# Patient Record
Sex: Female | Born: 1955
Health system: Southern US, Community
[De-identification: ages and names within clinical notes are randomized; demographics above are authoritative.]

## PROBLEM LIST (undated history)

## (undated) DIAGNOSIS — C801 Malignant (primary) neoplasm, unspecified: Secondary | ICD-10-CM

## (undated) DIAGNOSIS — M545 Low back pain, unspecified: Secondary | ICD-10-CM

## (undated) DIAGNOSIS — I890 Lymphedema, not elsewhere classified: Secondary | ICD-10-CM

## (undated) DIAGNOSIS — M791 Myalgia, unspecified site: Secondary | ICD-10-CM

## (undated) DIAGNOSIS — E063 Autoimmune thyroiditis: Secondary | ICD-10-CM

## (undated) DIAGNOSIS — E079 Disorder of thyroid, unspecified: Secondary | ICD-10-CM

## (undated) DIAGNOSIS — J309 Allergic rhinitis, unspecified: Secondary | ICD-10-CM

## (undated) DIAGNOSIS — K589 Irritable bowel syndrome without diarrhea: Secondary | ICD-10-CM

## (undated) HISTORY — DX: Autoimmune thyroiditis: E06.3

## (undated) HISTORY — DX: Disorder of thyroid, unspecified: E07.9

## (undated) HISTORY — PX: ABDOMINAL HYSTERECTOMY: SHX81

## (undated) HISTORY — DX: Low back pain: M54.5

## (undated) HISTORY — DX: Lymphedema, not elsewhere classified: I89.0

## (undated) HISTORY — DX: Myalgia, unspecified site: M79.10

## (undated) HISTORY — DX: Malignant (primary) neoplasm, unspecified: C80.1

## (undated) HISTORY — DX: Low back pain, unspecified: M54.50

## (undated) HISTORY — DX: Irritable bowel syndrome, unspecified: K58.9

## (undated) HISTORY — DX: Allergic rhinitis, unspecified: J30.9

---

## 2008-04-04 ENCOUNTER — Ambulatory Visit: Payer: Self-pay | Admitting: Gynecologic Oncology

## 2009-07-24 ENCOUNTER — Ambulatory Visit: Payer: Self-pay | Admitting: Gynecologic Oncology

## 2011-06-02 DIAGNOSIS — I890 Lymphedema, not elsewhere classified: Secondary | ICD-10-CM | POA: Insufficient documentation

## 2011-06-02 DIAGNOSIS — Z8541 Personal history of malignant neoplasm of cervix uteri: Secondary | ICD-10-CM | POA: Insufficient documentation

## 2011-09-01 ENCOUNTER — Ambulatory Visit: Payer: Self-pay | Admitting: Gynecologic Oncology

## 2012-11-22 ENCOUNTER — Ambulatory Visit: Payer: Self-pay | Admitting: Gynecologic Oncology

## 2013-01-18 ENCOUNTER — Ambulatory Visit: Payer: Self-pay

## 2013-04-26 ENCOUNTER — Ambulatory Visit: Payer: Self-pay | Admitting: Rheumatology

## 2013-11-27 ENCOUNTER — Ambulatory Visit: Payer: Self-pay | Admitting: Gynecologic Oncology

## 2014-03-31 ENCOUNTER — Ambulatory Visit: Payer: Self-pay

## 2014-03-31 LAB — COMPREHENSIVE METABOLIC PANEL
ALBUMIN: 3.1 g/dL — AB (ref 3.4–5.0)
ALK PHOS: 71 U/L
ALT: 25 U/L (ref 12–78)
ANION GAP: 11 (ref 7–16)
BUN: 6 mg/dL — AB (ref 7–18)
Bilirubin,Total: 0.2 mg/dL (ref 0.2–1.0)
CALCIUM: 8.8 mg/dL (ref 8.5–10.1)
CHLORIDE: 100 mmol/L (ref 98–107)
CREATININE: 0.75 mg/dL (ref 0.60–1.30)
Co2: 25 mmol/L (ref 21–32)
EGFR (African American): >60
EGFR (Non-African Amer.): >60
Glucose: 88 mg/dL (ref 65–99)
Osmolality: 269 (ref 275–301)
Potassium: 2.9 mmol/L — ABNORMAL LOW (ref 3.5–5.1)
SGOT(AST): 27 U/L (ref 15–37)
Sodium: 136 mmol/L (ref 136–145)
TOTAL PROTEIN: 6.9 g/dL (ref 6.4–8.2)

## 2014-03-31 LAB — RAPID INFLUENZA A&B ANTIGENS (ARMC ONLY)

## 2014-03-31 LAB — CBC WITH DIFFERENTIAL/PLATELET
BASOS PCT: 0.5 %
Basophil #: 0 10*3/uL (ref 0.0–0.1)
EOS ABS: 0 10*3/uL (ref 0.0–0.7)
Eosinophil %: 0.9 %
HCT: 46.4 % (ref 35.0–47.0)
HGB: 15.5 g/dL (ref 12.0–16.0)
LYMPHS ABS: 1 10*3/uL (ref 1.0–3.6)
Lymphocyte %: 27.5 %
MCH: 29.5 pg (ref 26.0–34.0)
MCHC: 33.3 g/dL (ref 32.0–36.0)
MCV: 89 fL (ref 80–100)
Monocyte #: 0.3 x10 3/mm (ref 0.2–0.9)
Monocyte %: 9 %
NEUTROS PCT: 62.1 %
Neutrophil #: 2.2 10*3/uL (ref 1.4–6.5)
Platelet: 161 10*3/uL (ref 150–440)
RBC: 5.24 10*6/uL — ABNORMAL HIGH (ref 3.80–5.20)
RDW: 12.6 % (ref 11.5–14.5)
WBC: 3.6 10*3/uL (ref 3.6–11.0)

## 2014-03-31 LAB — URINALYSIS, COMPLETE
BLOOD: NEGATIVE
Bilirubin,UR: NEGATIVE
GLUCOSE, UR: NEGATIVE mg/dL (ref 0–75)
Ketone: NEGATIVE
Leukocyte Esterase: NEGATIVE
Nitrite: NEGATIVE
Ph: 6.5 (ref 4.5–8.0)
Protein: NEGATIVE
Specific Gravity: 1.01 (ref 1.003–1.030)

## 2014-04-02 LAB — URINE CULTURE

## 2014-04-03 ENCOUNTER — Emergency Department: Payer: Self-pay | Admitting: Emergency Medicine

## 2014-04-03 ENCOUNTER — Other Ambulatory Visit: Payer: Self-pay | Admitting: Family Medicine

## 2014-04-03 LAB — MONONUCLEOSIS SCREEN: Mono Test: NEGATIVE

## 2014-04-03 LAB — URINALYSIS, COMPLETE
BACTERIA: NONE SEEN
BLOOD: NEGATIVE
Bilirubin,UR: NEGATIVE
GLUCOSE, UR: NEGATIVE mg/dL (ref 0–75)
Ketone: NEGATIVE
LEUKOCYTE ESTERASE: NEGATIVE
NITRITE: NEGATIVE
Ph: 8 (ref 4.5–8.0)
Protein: NEGATIVE
RBC,UR: 1 /HPF (ref 0–5)
Specific Gravity: 1.009 (ref 1.003–1.030)
Squamous Epithelial: <1
WBC UR: NONE SEEN /HPF (ref 0–5)

## 2014-04-03 LAB — HEPATIC FUNCTION PANEL A (ARMC)
AST: 25 U/L (ref 15–37)
Albumin: 3.4 g/dL (ref 3.4–5.0)
Alkaline Phosphatase: 76 U/L
BILIRUBIN DIRECT: 0.1 mg/dL (ref 0.00–0.20)
Bilirubin,Total: 0.5 mg/dL (ref 0.2–1.0)
SGPT (ALT): 32 U/L (ref 12–78)
Total Protein: 7.7 g/dL (ref 6.4–8.2)

## 2014-04-03 LAB — CBC WITH DIFFERENTIAL/PLATELET
Basophil #: 0 10*3/uL (ref 0.0–0.1)
Basophil %: 0.3 %
EOS PCT: 1 %
Eosinophil #: 0 10*3/uL (ref 0.0–0.7)
HCT: 48.6 % — AB (ref 35.0–47.0)
HGB: 16.3 g/dL — ABNORMAL HIGH (ref 12.0–16.0)
Lymphocyte #: 1.4 10*3/uL (ref 1.0–3.6)
Lymphocyte %: 36.8 %
MCH: 29.2 pg (ref 26.0–34.0)
MCHC: 33.5 g/dL (ref 32.0–36.0)
MCV: 87 fL (ref 80–100)
MONO ABS: 0.3 x10 3/mm (ref 0.2–0.9)
MONOS PCT: 6.8 %
NEUTROS ABS: 2.1 10*3/uL (ref 1.4–6.5)
Neutrophil %: 55.1 %
Platelet: 206 10*3/uL (ref 150–440)
RBC: 5.57 10*6/uL — ABNORMAL HIGH (ref 3.80–5.20)
RDW: 12.6 % (ref 11.5–14.5)
WBC: 3.8 10*3/uL (ref 3.6–11.0)

## 2014-04-03 LAB — POTASSIUM: Potassium: 3 mmol/L — ABNORMAL LOW (ref 3.5–5.1)

## 2014-04-03 LAB — BASIC METABOLIC PANEL
Anion Gap: 8 (ref 7–16)
BUN: 4 mg/dL — ABNORMAL LOW (ref 7–18)
CREATININE: 0.67 mg/dL (ref 0.60–1.30)
Calcium, Total: 9.2 mg/dL (ref 8.5–10.1)
Chloride: 100 mmol/L (ref 98–107)
Co2: 30 mmol/L (ref 21–32)
EGFR (African American): >60
EGFR (Non-African Amer.): >60
Glucose: 120 mg/dL — ABNORMAL HIGH (ref 65–99)
Osmolality: 274 (ref 275–301)
Potassium: 2.8 mmol/L — ABNORMAL LOW (ref 3.5–5.1)
Sodium: 138 mmol/L (ref 136–145)

## 2014-04-03 LAB — TROPONIN I: Troponin-I: <0.02 ng/mL

## 2014-04-03 LAB — CK TOTAL AND CKMB (NOT AT ARMC)
CK, TOTAL: 38 U/L
CK-MB: <0.5 ng/mL — ABNORMAL LOW (ref 0.5–3.6)

## 2014-04-03 LAB — MAGNESIUM: MAGNESIUM: 1.7 mg/dL — AB

## 2014-04-13 ENCOUNTER — Other Ambulatory Visit: Payer: Self-pay | Admitting: Family Medicine

## 2014-04-13 LAB — BASIC METABOLIC PANEL
Anion Gap: 6 — ABNORMAL LOW (ref 7–16)
BUN: 6 mg/dL — ABNORMAL LOW (ref 7–18)
CHLORIDE: 106 mmol/L (ref 98–107)
Calcium, Total: 9.1 mg/dL (ref 8.5–10.1)
Co2: 31 mmol/L (ref 21–32)
Creatinine: 0.88 mg/dL (ref 0.60–1.30)
EGFR (African American): >60
Glucose: 62 mg/dL — ABNORMAL LOW (ref 65–99)
Osmolality: 281 (ref 275–301)
Potassium: 3.8 mmol/L (ref 3.5–5.1)
Sodium: 143 mmol/L (ref 136–145)

## 2014-04-13 LAB — MAGNESIUM: Magnesium: 1.8 mg/dL

## 2014-11-28 ENCOUNTER — Ambulatory Visit: Payer: Self-pay | Admitting: Gynecologic Oncology

## 2015-05-26 IMAGING — MG MM DIGITAL SCREENING BILAT W/ CAD
5 series · 5 of 5 positions shown · non-contrast
Comparison: Previous exam(s).

CLINICAL DATA: Screening.

EXAM:
DIGITAL SCREENING BILATERAL MAMMOGRAM WITH CAD

[R CC]
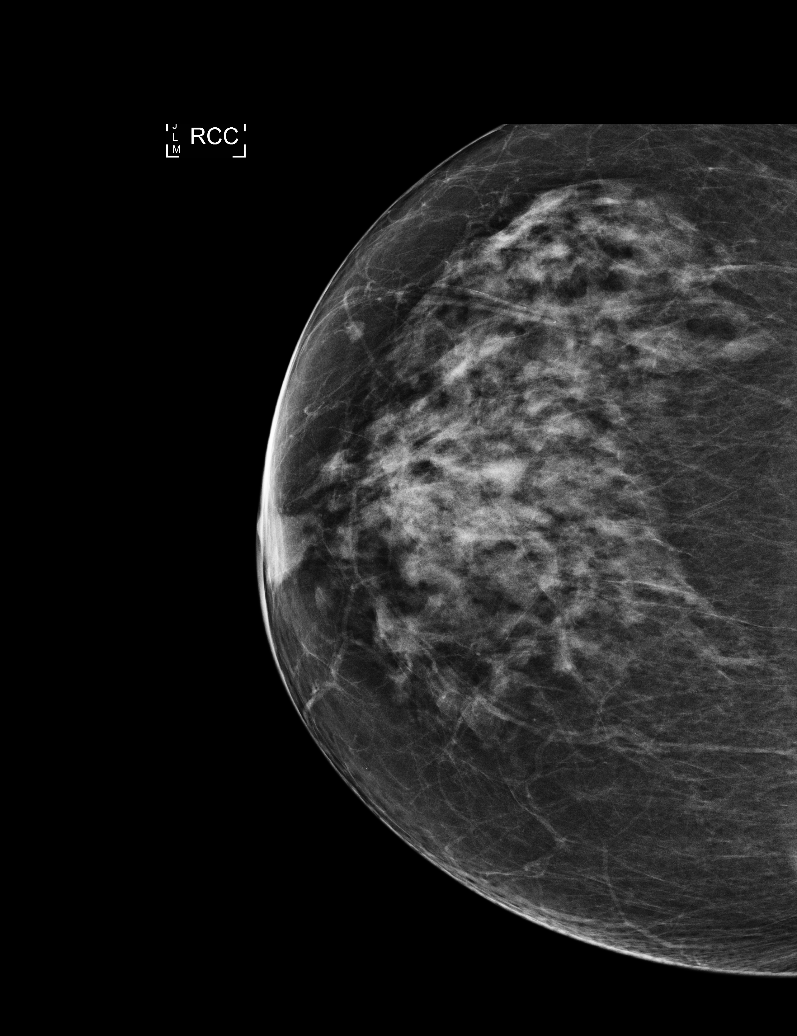

[L CC]
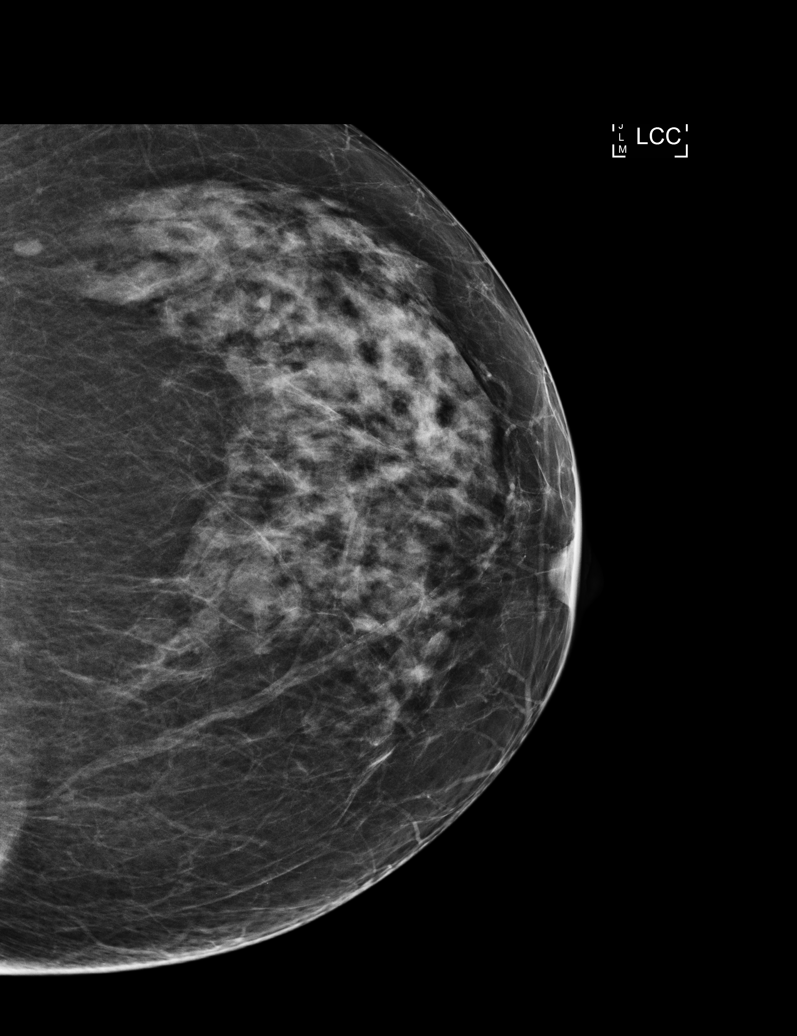

[R MLO]
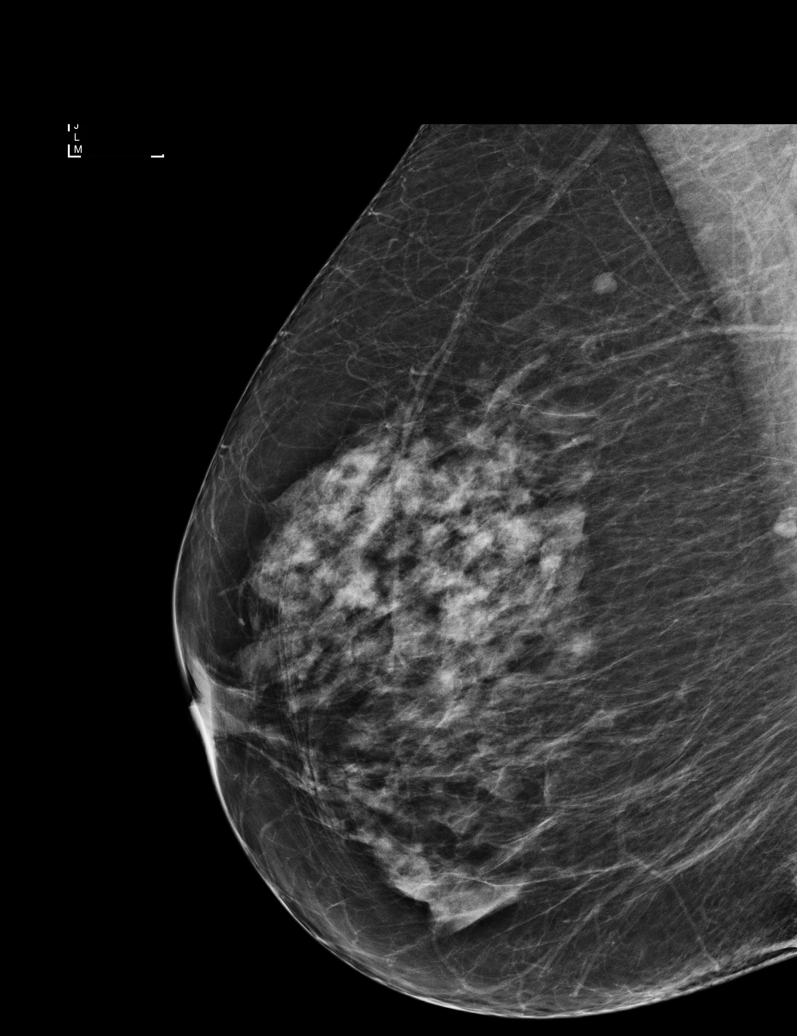

[L MLO (1 of 2)]
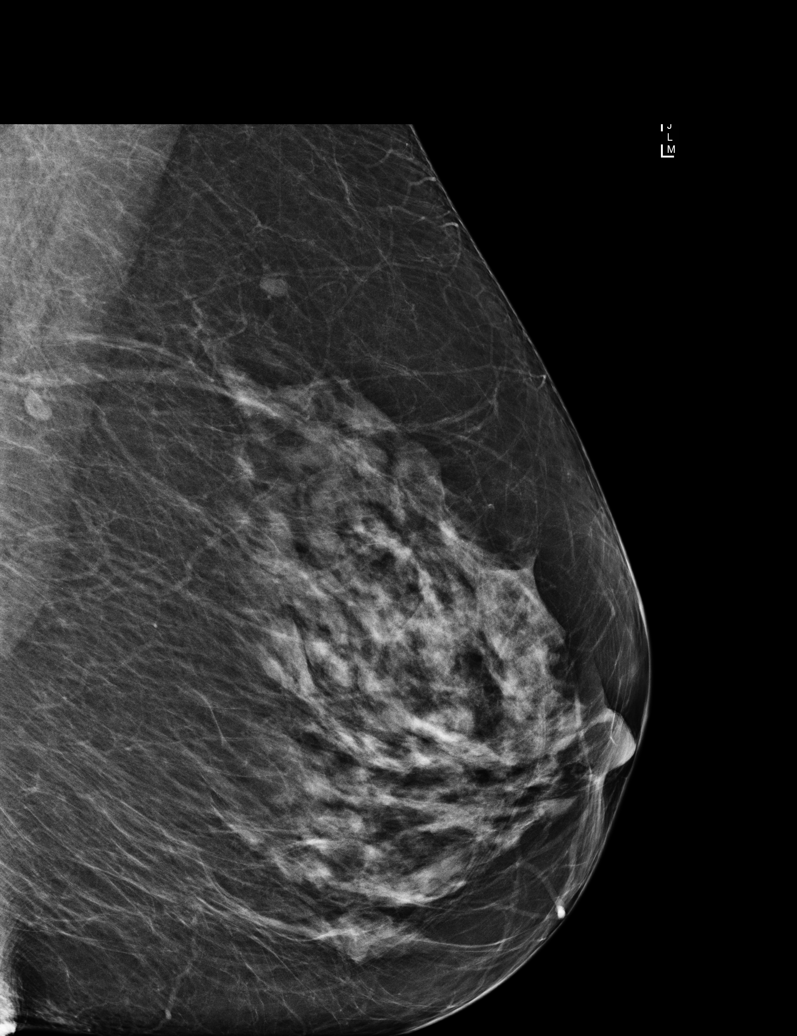

[L MLO (2 of 2)]
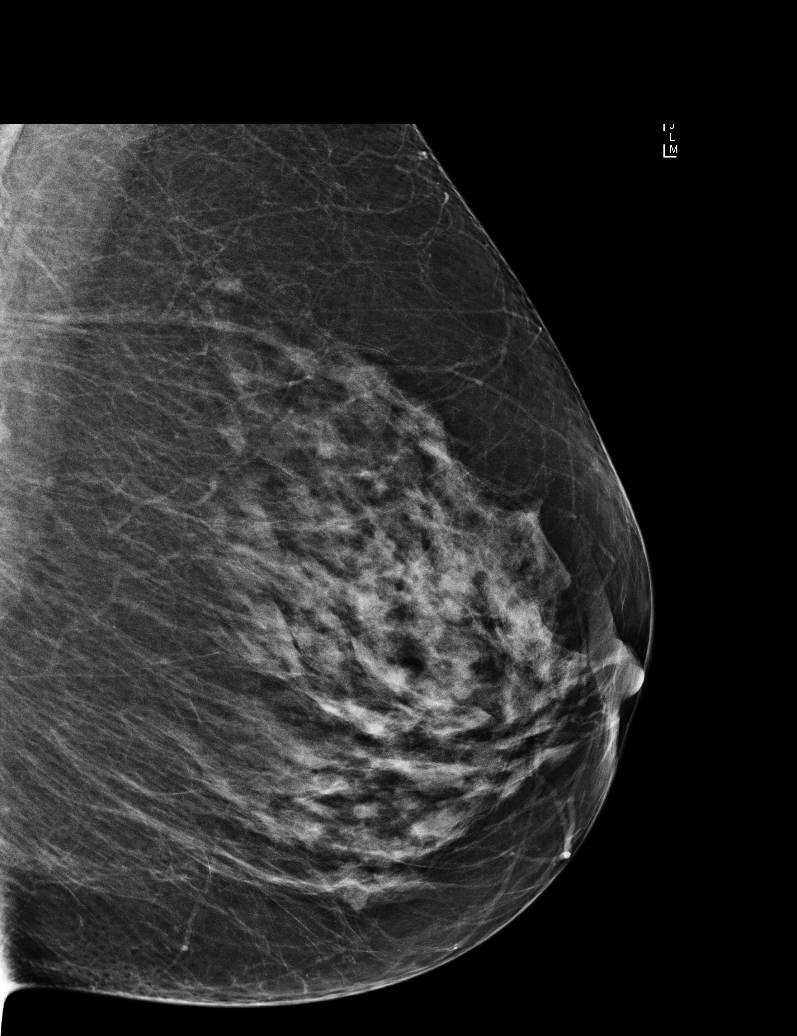

[5 of 5 positions shown; findings below may reference images not displayed]

ACR Breast Density Category c: The breast tissue is heterogeneously
dense, which may obscure small masses.
FINDINGS: There are no findings suspicious for malignancy. Images were
processed with CAD.
IMPRESSION: No mammographic evidence of malignancy. A result letter of this
screening mammogram will be mailed directly to the patient.

RECOMMENDATION:
Screening mammogram in one year. (Code:[id number])

BI-RADS CATEGORY  1: Negative.

## 2015-07-22 ENCOUNTER — Other Ambulatory Visit: Payer: Self-pay

## 2015-07-22 NOTE — Telephone Encounter (Signed)
Patient was last seen 03/24/15, practice partner number is [phone], and pharmacy is [facility].

## 2015-07-23 MED ORDER — LEVOTHYROXINE SODIUM 88 MCG PO TABS
88.0000 ug | ORAL_TABLET | Freq: Every day | ORAL | Status: DC
Start: 2015-07-23 — End: 2015-11-26

## 2015-08-28 ENCOUNTER — Encounter: Payer: Self-pay | Admitting: Family Medicine

## 2015-08-28 ENCOUNTER — Ambulatory Visit (INDEPENDENT_AMBULATORY_CARE_PROVIDER_SITE_OTHER): Payer: 59 | Admitting: Family Medicine

## 2015-08-28 VITALS — BP 116/80 | HR 71 | Temp 98.2°F | Ht 62.5 in | Wt 155.0 lb

## 2015-08-28 DIAGNOSIS — K589 Irritable bowel syndrome without diarrhea: Secondary | ICD-10-CM

## 2015-08-28 DIAGNOSIS — R5383 Other fatigue: Secondary | ICD-10-CM

## 2015-08-28 DIAGNOSIS — M791 Myalgia, unspecified site: Secondary | ICD-10-CM

## 2015-08-28 DIAGNOSIS — R599 Enlarged lymph nodes, unspecified: Secondary | ICD-10-CM | POA: Insufficient documentation

## 2015-08-28 DIAGNOSIS — M25512 Pain in left shoulder: Secondary | ICD-10-CM | POA: Diagnosis not present

## 2015-08-28 DIAGNOSIS — E039 Hypothyroidism, unspecified: Secondary | ICD-10-CM

## 2015-08-28 DIAGNOSIS — I890 Lymphedema, not elsewhere classified: Secondary | ICD-10-CM

## 2015-08-28 DIAGNOSIS — M797 Fibromyalgia: Secondary | ICD-10-CM | POA: Insufficient documentation

## 2015-08-28 DIAGNOSIS — M545 Low back pain, unspecified: Secondary | ICD-10-CM | POA: Insufficient documentation

## 2015-08-28 DIAGNOSIS — I10 Essential (primary) hypertension: Secondary | ICD-10-CM | POA: Insufficient documentation

## 2015-08-28 DIAGNOSIS — I899 Noninfective disorder of lymphatic vessels and lymph nodes, unspecified: Secondary | ICD-10-CM

## 2015-08-28 DIAGNOSIS — J309 Allergic rhinitis, unspecified: Secondary | ICD-10-CM | POA: Insufficient documentation

## 2015-08-28 DIAGNOSIS — C539 Malignant neoplasm of cervix uteri, unspecified: Secondary | ICD-10-CM

## 2015-08-28 MED ORDER — ETODOLAC 400 MG PO TABS: 400.0000 mg | ORAL_TABLET | Freq: Two times a day (BID) | ORAL | Status: DC

## 2015-08-28 NOTE — Progress Notes (Signed)
BP 116/80 mmHg  Pulse 71  Temp(Src) 98.2 F (36.8 C)  Ht 5' 2.5" (1.588 m)  Wt 155 lb (70.308 kg)  BMI 27.88 kg/m2  SpO2 98%  LMP  (LMP Unknown)   Subjective:    Patient ID: Robin Soto, female    DOB: 1956/01/20, 59 y.o.   MRN: [id number]  HPI: Robin Soto is a 59 y.o. female  Chief Complaint  Patient presents with  . Shoulder Pain    pt states she is having pain in left shoulder and has for about a month now.   SHOULDER PAIN Duration: chronic, but has been acting up a lot more over the past month Involved shoulder: bilateral, left worse Mechanism of injury: unknown Location: diffuse Onset:gradual Severity: 8/10  Quality:  dull and aching Frequency: constant Radiation: yes- into her neck Aggravating factors:cold, touching it, laying on it   Alleviating factors: heat and NSAIDs  Status: worse Treatments attempted: rest, ice and heat, has tried enterolac  Relief with NSAIDs?:  moderate Weakness: no Numbness: no Decreased grip strength: yes Redness: no Swelling: no Bruising: no Fevers: no  Relevant past medical, surgical, family and social history reviewed and updated as indicated. Interim medical history since our last visit reviewed. Allergies and medications reviewed and updated.  Review of Systems  Constitutional: Negative.   Respiratory: Negative.   Cardiovascular: Negative.   Musculoskeletal: Positive for myalgias, neck pain and neck stiffness. Negative for back pain, joint swelling, arthralgias and gait problem.  Psychiatric/Behavioral: Negative.    Per HPI unless specifically indicated above     Objective:    BP 116/80 mmHg  Pulse 71  Temp(Src) 98.2 F (36.8 C)  Ht 5' 2.5" (1.588 m)  Wt 155 lb (70.308 kg)  BMI 27.88 kg/m2  SpO2 98%  LMP  (LMP Unknown)  Wt Readings from Last 3 Encounters:  08/28/15 155 lb (70.308 kg)  03/24/15 147 lb (66.679 kg)    Physical Exam  Constitutional: She is oriented to person, place, and time. She  appears well-developed and well-nourished. No distress.  HENT:  Head: Normocephalic and atraumatic.  Right Ear: Hearing normal.  Left Ear: Hearing normal.  Nose: Nose normal.  Eyes: Conjunctivae and lids are normal. Right eye exhibits no discharge. Left eye exhibits no discharge. No scleral icterus.  Cardiovascular: Normal rate, regular rhythm, normal heart sounds and intact distal pulses.  Exam reveals no gallop and no friction rub.   No murmur heard. Pulmonary/Chest: Effort normal and breath sounds normal. No respiratory distress. She has no wheezes. She has no rales. She exhibits no tenderness.  Neurological: She is alert and oriented to person, place, and time.  Skin: Skin is intact. No rash noted.  Psychiatric: She has a normal mood and affect. Her speech is normal and behavior is normal. Judgment and thought content normal. Cognition and memory are normal.  Nursing note and vitals reviewed.     Shoulder: left    Inspection:  no swelling, ecchymosis, erythema or step off deformity.    Tenderness to Palpation:    Acromion: yes    AC joint:yes    Clavicle: yes    Bicipital groove: yes    Scapular spine: yes    Coracoid process: yes    Humeral head: yes    Supraspinatus tendon: yes     Range of Motion: Decreased active ROM, full passive range of motion    Painful arc: yes     Muscle Strength: 5/5 bilaterally    Neuro:  Sensation WNL. and Upper extremity reflexes WNL.   Results for orders placed or performed in visit on 81/82/99  Basic metabolic panel  Result Value Ref Range   Glucose 62 (L) 65-99 mg/dL   BUN 6 (L) 7-18 mg/dL   Creatinine 0.88 0.60-1.30 mg/dL   Sodium 143 136-145 mmol/L   Potassium 3.8 3.5-5.1 mmol/L   Chloride 106 98-107 mmol/L   Co2 31 21-32 mmol/L   Calcium, Total 9.1 8.5-10.1 mg/dL   Osmolality 281 275-301   Anion Gap 6 (L) 7-16   EGFR (African American) >60    EGFR (Non-African Amer.) >60   Magnesium  Result Value Ref Range   Magnesium 1.8  mg/dL      Assessment & Plan:   Problem List Items Addressed This Visit    None    Visit Diagnoses    Left shoulder pain    -  Primary    Possibly fibro flare. Will restart etodolac from her rheumatologist. Frozen shoulder exercises given today to maintain mobility. Heat. Follow up with PCP 1 mnth        Follow up plan: Return as scheduled.

## 2015-09-12 ENCOUNTER — Other Ambulatory Visit: Payer: Self-pay

## 2015-09-12 MED ORDER — TRAMADOL HCL 50 MG PO TABS: 50.0000 mg | ORAL_TABLET | Freq: Two times a day (BID) | ORAL | Status: DC | PRN

## 2015-09-12 NOTE — Telephone Encounter (Signed)
Patient has appointment on 09/24/15 and pharmacy is [facility].

## 2015-09-24 ENCOUNTER — Ambulatory Visit (INDEPENDENT_AMBULATORY_CARE_PROVIDER_SITE_OTHER): Payer: 59 | Admitting: Unknown Physician Specialty

## 2015-09-24 ENCOUNTER — Encounter: Payer: Self-pay | Admitting: Unknown Physician Specialty

## 2015-09-24 ENCOUNTER — Other Ambulatory Visit: Payer: Self-pay | Admitting: Gynecologic Oncology

## 2015-09-24 VITALS — BP 110/72 | HR 80 | Temp 98.0°F | Ht 62.0 in | Wt 154.0 lb

## 2015-09-24 DIAGNOSIS — M7542 Impingement syndrome of left shoulder: Secondary | ICD-10-CM

## 2015-09-24 DIAGNOSIS — M797 Fibromyalgia: Secondary | ICD-10-CM | POA: Diagnosis not present

## 2015-09-24 DIAGNOSIS — Z1231 Encounter for screening mammogram for malignant neoplasm of breast: Secondary | ICD-10-CM

## 2015-09-24 MED ORDER — TRAMADOL HCL 50 MG PO TABS: 50.0000 mg | ORAL_TABLET | Freq: Four times a day (QID) | ORAL | Status: DC | PRN

## 2015-09-24 NOTE — Assessment & Plan Note (Signed)
Take Flexeril at HS.  Refill Tramadol

## 2015-09-24 NOTE — Progress Notes (Signed)
+++++++  BP 110/72 mmHg  Pulse 80  Temp(Src) 98 F (36.7 C)  Ht _0  (1.575 m)  Wt 154 lb (69.854 kg)  BMI 28.16 kg/m2  SpO2 97%  LMP  (LMP Unknown)   Subjective:    Patient ID: Robin Soto, female    DOB: 11-Mar-1956, 59 y.o.   MRN: [id number]  HPI: Robin Soto is a 59 y.o. female  Chief Complaint  Patient presents with  . Medication Refill    pt states she needs refill on tramadol, wants to change dosage   Fibromyalgia Pt is taking Etolodac for her left shoulder pain.  See previous note with Dr. Wynetta Emery.  Her boyfriend gave her a vitamin supplement for Fibromyalgia she brought in and would like to see if it's OK.  Shoulder is bothering but at night it hurts.  She doesn't take her Flexeril nightly.  Her shoulder seems to be bothering most.   She would like a refill of her Tramadol and sometimes takes 4/day on bad days but sometimes doesn't take at all  Hypertension Using medications without difficulty No problems or lightheadedness No chest pain with exertion or shortness of breath No Edema  Relevant past medical, surgical, family and social history reviewed and updated as indicated. Interim medical history since our last visit reviewed. Allergies and medications reviewed and updated.  Review of Systems  Per HPI unless specifically indicated above     Objective:    BP 110/72 mmHg  Pulse 80  Temp(Src) 98 F (36.7 C)  Ht _1  (1.575 m)  Wt 154 lb (69.854 kg)  BMI 28.16 kg/m2  SpO2 97%  LMP  (LMP Unknown)  Wt Readings from Last 3 Encounters:  09/24/15 154 lb (69.854 kg)  08/28/15 155 lb (70.308 kg)  03/24/15 147 lb (66.679 kg)    Physical Exam  Constitutional: She is oriented to person, place, and time. She appears well-developed and well-nourished. No distress.  HENT:  Head: Normocephalic and atraumatic.  Eyes: Conjunctivae and lids are normal. Right eye exhibits no discharge. Left eye exhibits no discharge. No scleral icterus.  Cardiovascular:  Normal rate and regular rhythm.   Pulmonary/Chest: Effort normal and breath sounds normal.  Abdominal: Normal appearance. There is no splenomegaly or hepatomegaly.  Musculoskeletal: Normal range of motion.       Left shoulder: She exhibits tenderness, swelling and pain.  Positive for impingement  Neurological: She is alert and oriented to person, place, and time.  Skin: Skin is intact. No rash noted. No pallor.  Psychiatric: She has a normal mood and affect. Her behavior is normal. Judgment and thought content normal.   STEROID INJECTION  Procedure: Shoulder Intraarticular Steroid Injection Consent: DEL Risks, benefits, and alternative treatments discussed and all questions were answered.  Patient elected to proceed and verbal consent obtained.  Description: Area prepped and draped using   semi-sterile technique. Using a posterolateral approach a mixture of 4cc 0.5% marcaine & 1 cc Kenalog 40 injected in shoulder joint.  A bandage was then placed over the injection site. Complications:  none      Results for orders placed or performed in visit on 17/00/17  Basic metabolic panel  Result Value Ref Range   Glucose 62 (L) 65-99 mg/dL   BUN 6 (L) 7-18 mg/dL   Creatinine 0.88 0.60-1.30 mg/dL   Sodium 143 136-145 mmol/L   Potassium 3.8 3.5-5.1 mmol/L   Chloride 106 98-107 mmol/L   Co2 31 21-32 mmol/L   Calcium, Total 9.1  8.5-10.1 mg/dL   Osmolality 281 275-301   Anion Gap 6 (L) 7-16   EGFR (African American) >60    EGFR (Non-African Amer.) >60   Magnesium  Result Value Ref Range   Magnesium 1.8 mg/dL      Assessment & Plan:   Problem List Items Addressed This Visit      Unprioritized   Fibromyalgia    Take Flexeril at HS.  Refill Tramadol       Other Visit Diagnoses    Shoulder impingement, left    -  Primary        Follow up plan: Return for physical.

## 2015-10-22 ENCOUNTER — Other Ambulatory Visit: Payer: Self-pay | Admitting: Family Medicine

## 2015-10-23 NOTE — Telephone Encounter (Signed)
Creatinine and K+ from May 2016 in PP reviewed; last BP 110/72; Rx approved

## 2015-11-24 ENCOUNTER — Encounter: Payer: Self-pay | Admitting: Unknown Physician Specialty

## 2015-11-24 ENCOUNTER — Ambulatory Visit (INDEPENDENT_AMBULATORY_CARE_PROVIDER_SITE_OTHER): Payer: 59 | Admitting: Unknown Physician Specialty

## 2015-11-24 VITALS — BP 112/76 | HR 71 | Temp 97.9°F | Ht 61.8 in | Wt 154.6 lb

## 2015-11-24 DIAGNOSIS — R002 Palpitations: Secondary | ICD-10-CM | POA: Diagnosis not present

## 2015-11-24 DIAGNOSIS — E039 Hypothyroidism, unspecified: Secondary | ICD-10-CM | POA: Diagnosis not present

## 2015-11-24 DIAGNOSIS — M797 Fibromyalgia: Secondary | ICD-10-CM | POA: Diagnosis not present

## 2015-11-24 DIAGNOSIS — M545 Low back pain: Secondary | ICD-10-CM | POA: Diagnosis not present

## 2015-11-24 DIAGNOSIS — Z0000 Encounter for general adult medical examination without abnormal findings: Secondary | ICD-10-CM

## 2015-11-24 NOTE — Progress Notes (Signed)
BP 112/76 mmHg  Pulse 71  Temp(Src) 97.9 F (36.6 C)  Ht 5' 1.8" (1.57 m)  Wt 154 lb 9.6 oz (70.126 kg)  BMI 28.45 kg/m2  SpO2 97%  LMP  (LMP Unknown)   Subjective:    Patient ID: Robin Soto, female    DOB: 1956-07-23, 60 y.o.   MRN: [id number]  HPI: Robin Soto is a 60 y.o. female  Chief Complaint  Patient presents with  . Annual Exam    pt states she has been having some pain in her lower back for about 2 to 3 weeks now and states her heart feels like it has been fluttering   Fibromyalgia Stable.  Back pain today.  She has not taken any Tramadol since last here as she is taking called [employer]well Fibro support that is OTC.    Palpitations A fib runs in the family and is concerned.    Relevant past medical, surgical, family and social history reviewed and updated as indicated. Interim medical history since our last visit reviewed. Allergies and medications reviewed and updated.  Review of Systems  Per HPI unless specifically indicated above     Objective:    BP 112/76 mmHg  Pulse 71  Temp(Src) 97.9 F (36.6 C)  Ht 5' 1.8" (1.57 m)  Wt 154 lb 9.6 oz (70.126 kg)  BMI 28.45 kg/m2  SpO2 97%  LMP  (LMP Unknown)  Wt Readings from Last 3 Encounters:  11/24/15 154 lb 9.6 oz (70.126 kg)  09/24/15 154 lb (69.854 kg)  08/28/15 155 lb (70.308 kg)    Physical Exam  Constitutional: She is oriented to person, place, and time. She appears well-developed and well-nourished.  HENT:  Head: Normocephalic and atraumatic.  Eyes: Pupils are equal, round, and reactive to light. Right eye exhibits no discharge. Left eye exhibits no discharge. No scleral icterus.  Neck: Normal range of motion. Neck supple. Carotid bruit is not present. No thyromegaly present.  Cardiovascular: Normal rate, regular rhythm and normal heart sounds.  Exam reveals no gallop and no friction rub.   No murmur heard. Pulmonary/Chest: Effort normal and breath sounds normal. No respiratory distress.  She has no wheezes. She has no rales.  Abdominal: Soft. Bowel sounds are normal. There is no tenderness. There is no rebound.  Genitourinary: No breast swelling, tenderness or discharge.  Seeing [facility] gyn as s/p gyn cancer  Musculoskeletal: Normal range of motion.  Lymphadenopathy:    She has no cervical adenopathy.  Neurological: She is alert and oriented to person, place, and time.  Skin: Skin is warm, dry and intact. No rash noted.  Psychiatric: She has a normal mood and affect. Her speech is normal and behavior is normal. Judgment and thought content normal. Cognition and memory are normal.    Results for orders placed or performed in visit on 90/24/09  Basic metabolic panel  Result Value Ref Range   Glucose 62 (L) 65-99 mg/dL   BUN 6 (L) 7-18 mg/dL   Creatinine 0.88 0.60-1.30 mg/dL   Sodium 143 136-145 mmol/L   Potassium 3.8 3.5-5.1 mmol/L   Chloride 106 98-107 mmol/L   Co2 31 21-32 mmol/L   Calcium, Total 9.1 8.5-10.1 mg/dL   Osmolality 281 275-301   Anion Gap 6 (L) 7-16   EGFR (African American) >60    EGFR (Non-African Amer.) >60   Magnesium  Result Value Ref Range   Magnesium 1.8 mg/dL      Assessment & Plan:   Problem List  Items Addressed This Visit      Unprioritized   Hypothyroidism   Relevant Orders   TSH   Fibromyalgia    Stable, continue present medications.         Other Visit Diagnoses    Left low back pain, with sciatica presence unspecified    -  Primary    Relevant Orders    UA/M w/rflx Culture, Routine    Routine general medical examination at a health care facility        Relevant Orders    Hepatitis C antibody    HIV antibody    Comprehensive metabolic panel    CBC with Differential/Platelet    Lipid Panel w/o Chol/HDL Ratio    Palpitations        Will refer to cardiology for consideration of holter.  Check TSH    Relevant Orders    Comprehensive metabolic panel    EKG 54-CYOY (Completed)    Ambulatory referral to Cardiology         Follow up plan: Return in about 6 months (around 05/23/2016).

## 2015-11-24 NOTE — Patient Instructions (Signed)
f you have chronic pain and are looking for alternatives to medication and surgery, you have a lot of options.   However, not all alternative pain treatments work. Some can even be risky. Some alternative treatments may help with pain from bad backs, osteoarthritis, and headaches, but have no effect on chronic pain from fibromyalgia or diabetic nerve damage.  Here's a rundown of the most commonly used alternative treatments for chronic pain.  Acupuncture. Once seen as bizarre, acupuncture is rapidly becoming a mainstream treatment for pain. Studies have found that it works for pain caused by many conditions, including fibromyalgia, osteoarthritis, back injuries, and sports injuries. How does it work? No one's quite sure. It could release pain-numbing chemicals in the body. Or it might block the pain signals coming from the nerves.  Exercise. Motion is lotion Going for a walk or swim isn't a treatment, exactly. But regular physical activity has big benefits for people with many different painful conditions. Study after study has found that physical activity can help relieve chronic pain, as well as boost energy and mood.   This is particularly true of pain related to arthritis .    Chiropractic manipulation. Although mainstream medicine has traditionally regarded spinal manipulation with suspicion, it's becoming a more accepted treatment.  I think many people respond will th chiropractic treatment.    Supplements and vitamins. There is evidence that certain dietary supplements and vitamins can help with certain types of pain. Fish oil and flaxseed oil is often used to reduce pain associated with swelling. Topical capsaicin, derived from chili peppers, may help with arthritis, diabetic nerve pain, and other conditions. There's evidence that glucosamine can help relieve moderate to severe pain from osteoarthritis in the knee.  A spice Tumeric is used my many to treat chronic pain.  Curcumin is the active  ingredient and thought to have anti-inflammatory effects and reduces pain and stiffness.  This can be found as capsules or extract (more likely to be free of contaminants). For OA: Capsule, typically 400 mg to 600 mg, three times per day; or 0.5 g to 1 g of powdered root up to 3 g per day. For RA: 500 mg twice daily.  It's best absorbed if it contains black pepper.  Tart cherry juice is a natural anti-inflammatory agent.  We sometimes hear from people who would like to know where to find cherries out of season. Some report that their local market does not carry cherry juice. There are a number of reputable online vendors who could supply cherry concentrate so you can use cherry juice to see if it helps your arthritic joints. Studies have been done with powdered [place] cherries, CherryPURE. The usual dose is 480 mg/day. Manufacturers often combine several ingredients in 1 pill.  One of my patient is taking Truewell Fibro support she finds helpful.  Another I know is Zyflamed.  Please let me know if you find any products helpful for you that I can pass on to others  But when it comes to supplements, you have to be careful. High doses of B6 can damage the nerves.   Some studies suggest that supplements such as ginkgo biloba and ginseng can thin the blood and increase the risk of bleeding. This could lead to serious consequences for anyone getting surgery for chronic pain.  Finally, supplements don't always contain what they claim as supplement manufacturers are not regulated by the FDA.  I get very confused with the thousands of supplements available and which to   choose.  Brands to consider: [employer], Nature[employer], [employer], Garden of Life, [employer], Duwayne Heck and Tresa Garter   I usually go on [employer] and look for the highly rated products.  [employer] or [employer] are great resources and have done the research for you.     Cognitive Behavioral Therapy. Some people with chronic pain  balk at the idea of seeing a therapist -- they think it implies that their pain isn't real. But studies show that depression and chronic pain often go together. Chronic pain can cause or worsen depression; depression can lower a person's tolerance for pain.  Stress-reduction techniques. There are number of approaches, including: Yoga. There's good evidence that yoga can help with chronic pain,  specifically fibromyalgia, neck pain, back pain, and arthritis. Relaxation therapy. This is actually a category of techniques that help people calm the body and release tension -- a process that might also reduce pain. Some approaches teach people how to focus on their breathing. Research shows that relaxation therapy can help with fibromyalgia, headache, osteoarthritis, and other conditions. Hypnosis. Studies have found this approach helpful with different sorts of pain, like back pain, repetitive strain injuries, and cancer pain. Guided imagery. Research shows that guided imagery can help with conditions like headache pain, cancer pain, osteoarthritis, and fibromyalgia. How does it work? An expert would teach you ways to direct your thoughts by focusing on specific images. Music therapy. This approach gets people to either perform or listen to music. Studies have found that it can help with many different pain conditions, like osteoarthritis and cancer pain. Biofeedback. This approach teaches you how to control normally unconscious bodily functions, like blood pressure or your heart rate. Studies have found that it can help with headaches, fibromyalgia, and other conditions. Massage. It's undeniably relaxing. And there's some evidence that massage can help ease pain from rheumatoid arthritis, neck and back injuries, and fibromyalgia.  Risky Alternative Pain Treatments Experts say you should keep your expectations for alternative pain treatments modest -- especially when it comes to "miracle cures." Controlling  chronic pain is not simple. A single supplement, device, or treatment is not going to make your chronic pain disappear. You a need to be suspicious of anyone pushing a treatment when the financial motive is blatant. That doesn't only apply to pain treatments advertised on dubious web sites asking for your credit card number.  Experts say that you should try to keep up to date with research into alternative treatments for chronic pain. The options for people with chronic pain are always growing -- and some of the older treatments of today might become the mainstream treatments of tomorrow.  google yoga for back pain "beginners." A free yoga site is "Do Yoga with me."

## 2015-11-24 NOTE — Assessment & Plan Note (Signed)
Stable, continue present medications.   

## 2015-11-25 LAB — CBC WITH DIFFERENTIAL/PLATELET
BASOS: 0 %
Basophils Absolute: 0 10*3/uL (ref 0.0–0.2)
EOS (ABSOLUTE): 0 10*3/uL (ref 0.0–0.4)
EOS: 1 %
HEMOGLOBIN: 13.8 g/dL (ref 11.1–15.9)
Hematocrit: 41.4 % (ref 34.0–46.6)
IMMATURE GRANS (ABS): 0 10*3/uL (ref 0.0–0.1)
Immature Granulocytes: 0 %
LYMPHS: 29 %
Lymphocytes Absolute: 2.2 10*3/uL (ref 0.7–3.1)
MCH: 29.2 pg (ref 26.6–33.0)
MCHC: 33.3 g/dL (ref 31.5–35.7)
MCV: 88 fL (ref 79–97)
MONOCYTES: 8 %
Monocytes Absolute: 0.6 10*3/uL (ref 0.1–0.9)
NEUTROS ABS: 4.7 10*3/uL (ref 1.4–7.0)
Neutrophils: 62 %
PLATELETS: 334 10*3/uL (ref 150–379)
RBC: 4.73 x10E6/uL (ref 3.77–5.28)
RDW: 13 % (ref 12.3–15.4)
WBC: 7.7 10*3/uL (ref 3.4–10.8)

## 2015-11-25 LAB — LIPID PANEL W/O CHOL/HDL RATIO
CHOLESTEROL TOTAL: 205 mg/dL — AB (ref 100–199)
HDL: 59 mg/dL (ref >39)
LDL Calculated: 128 mg/dL — ABNORMAL HIGH (ref 0–99)
TRIGLYCERIDES: 90 mg/dL (ref 0–149)
VLDL Cholesterol Cal: 18 mg/dL (ref 5–40)

## 2015-11-25 LAB — COMPREHENSIVE METABOLIC PANEL
A/G RATIO: 1.5 (ref 1.1–2.5)
ALT: 12 IU/L (ref 0–32)
AST: 18 IU/L (ref 0–40)
Albumin: 4 g/dL (ref 3.5–5.5)
Alkaline Phosphatase: 80 IU/L (ref 39–117)
BUN/Creatinine Ratio: 16 (ref 9–23)
BUN: 12 mg/dL (ref 6–24)
Bilirubin Total: 0.5 mg/dL (ref 0.0–1.2)
CALCIUM: 9.8 mg/dL (ref 8.7–10.2)
CO2: 28 mmol/L (ref 18–29)
Chloride: 95 mmol/L — ABNORMAL LOW (ref 96–106)
Creatinine, Ser: 0.73 mg/dL (ref 0.57–1.00)
GFR, EST AFRICAN AMERICAN: 106 mL/min/{1.73_m2} (ref >59)
GFR, EST NON AFRICAN AMERICAN: 92 mL/min/{1.73_m2} (ref >59)
Globulin, Total: 2.7 g/dL (ref 1.5–4.5)
Glucose: 75 mg/dL (ref 65–99)
POTASSIUM: 4.1 mmol/L (ref 3.5–5.2)
Sodium: 138 mmol/L (ref 134–144)
TOTAL PROTEIN: 6.7 g/dL (ref 6.0–8.5)

## 2015-11-25 LAB — HIV ANTIBODY (ROUTINE TESTING W REFLEX): HIV SCREEN 4TH GENERATION: NONREACTIVE

## 2015-11-25 LAB — HEPATITIS C ANTIBODY: Hep C Virus Ab: <0.1 s/co ratio (ref 0.0–0.9)

## 2015-11-25 LAB — TSH: TSH: 8.12 u[IU]/mL — AB (ref 0.450–4.500)

## 2015-11-26 ENCOUNTER — Telehealth: Payer: Self-pay

## 2015-11-26 DIAGNOSIS — E039 Hypothyroidism, unspecified: Secondary | ICD-10-CM

## 2015-11-26 LAB — UA/M W/RFLX CULTURE, ROUTINE
BILIRUBIN UA: NEGATIVE
Glucose, UA: NEGATIVE
Ketones, UA: NEGATIVE
Nitrite, UA: NEGATIVE
PH UA: 7 (ref 5.0–7.5)
Protein, UA: NEGATIVE
RBC, UA: NEGATIVE
Specific Gravity, UA: 1.015 (ref 1.005–1.030)
Urobilinogen, Ur: 0.2 mg/dL (ref 0.2–1.0)

## 2015-11-26 LAB — URINE CULTURE, REFLEX

## 2015-11-26 LAB — MICROSCOPIC EXAMINATION

## 2015-11-26 MED ORDER — LEVOTHYROXINE SODIUM 100 MCG PO TABS: 100.0000 ug | ORAL_TABLET | Freq: Every day | ORAL | Status: DC

## 2015-11-26 NOTE — Telephone Encounter (Signed)
Patient called wanting to know about labs. I don't see where a letter was sent so is there anything I can tell her about her labs? Or did you already try to call her? Phone number is [phone].

## 2015-11-26 NOTE — Telephone Encounter (Signed)
Discussed with pt.  TSH is high.  Will increase Synthroid to 100 mg.  Recheck TSH in 3 months

## 2015-11-27 ENCOUNTER — Telehealth: Payer: Self-pay | Admitting: Unknown Physician Specialty

## 2015-11-27 NOTE — Telephone Encounter (Signed)
Pt would like a call back to discuss lab results and going to a specialist.

## 2015-11-28 NOTE — Telephone Encounter (Signed)
Called pt back and no answer.  Left a message to let her know if she wanted to see a thyroid specialist.  That is OK and to let us know

## 2015-11-28 NOTE — Telephone Encounter (Signed)
Routing to provider. I thought you had already spoke to this patient about labs?

## 2015-12-01 ENCOUNTER — Ambulatory Visit
Admission: RE | Admit: 2015-12-01 | Discharge: 2015-12-01 | Disposition: A | Discharge status: Home / Self Care | Payer: 59 | Source: Ambulatory Visit | Attending: Gynecologic Oncology | Admitting: Gynecologic Oncology

## 2015-12-01 ENCOUNTER — Telehealth: Payer: Self-pay

## 2015-12-01 DIAGNOSIS — Z1231 Encounter for screening mammogram for malignant neoplasm of breast: Secondary | ICD-10-CM

## 2015-12-01 NOTE — Telephone Encounter (Signed)
Patient called and stated that she was supposed to go see a cardiologist instead of thyroid specialist. Patient wanted to know if she could wait to see the cardiologist since her thyroid medication was changed. She states she wants to see if the change in medication helps before she goes to see the cardiologist.

## 2015-12-01 NOTE — Telephone Encounter (Signed)
I would like her to keep her appointment with cardiology as I increased her thyroid medication which may increase any sense of fluttering in her chest.  If I decreased the dose, I would feel differently.

## 2015-12-01 NOTE — Telephone Encounter (Signed)
Called and let patient know what Cheryl said.  

## 2015-12-19 DIAGNOSIS — I491 Atrial premature depolarization: Secondary | ICD-10-CM | POA: Insufficient documentation

## 2016-01-28 ENCOUNTER — Other Ambulatory Visit: Payer: Self-pay

## 2016-01-28 MED ORDER — LEVOTHYROXINE SODIUM 100 MCG PO TABS: 100.0000 ug | ORAL_TABLET | Freq: Every day | ORAL | Status: DC

## 2016-01-28 NOTE — Telephone Encounter (Signed)
Patient was seen 11/24/15 and has appointment 05/24/16. Pharmacy is [facility].

## 2016-05-12 ENCOUNTER — Other Ambulatory Visit: Payer: Self-pay | Admitting: Unknown Physician Specialty

## 2016-05-12 DIAGNOSIS — E039 Hypothyroidism, unspecified: Secondary | ICD-10-CM

## 2016-05-12 NOTE — Telephone Encounter (Signed)
Patient has upcoming appointment scheduled for 05/24/16.

## 2016-05-12 NOTE — Telephone Encounter (Signed)
Pt needs a TSH

## 2016-05-24 ENCOUNTER — Encounter: Payer: Self-pay | Admitting: Unknown Physician Specialty

## 2016-05-24 ENCOUNTER — Ambulatory Visit (INDEPENDENT_AMBULATORY_CARE_PROVIDER_SITE_OTHER): Payer: BLUE CROSS/BLUE SHIELD | Admitting: Unknown Physician Specialty

## 2016-05-24 DIAGNOSIS — I10 Essential (primary) hypertension: Secondary | ICD-10-CM | POA: Diagnosis not present

## 2016-05-24 DIAGNOSIS — M797 Fibromyalgia: Secondary | ICD-10-CM

## 2016-05-24 DIAGNOSIS — R5383 Other fatigue: Secondary | ICD-10-CM | POA: Diagnosis not present

## 2016-05-24 DIAGNOSIS — E039 Hypothyroidism, unspecified: Secondary | ICD-10-CM | POA: Diagnosis not present

## 2016-05-24 NOTE — Progress Notes (Signed)
BP 118/79   Pulse 62   Temp 98.5 F (36.9 C)   Ht 5' 2.3" (1.582 m)   Wt 157 lb 6.4 oz (71.4 kg)   LMP  (LMP Unknown)   SpO2 98%   BMI 28.51 kg/m    Subjective:    Patient ID: Robin Soto, female    DOB: 1956-06-21, 60 y.o.   MRN: [id number]  HPI: Robin Soto is a 60 y.o. female  Chief Complaint  Patient presents with  . Fibromyalgia  . Hypothyroidism   Fibromyalgia States she was doing really well until last weekend in which she worked a double shift and it flared especially left hip.  She takes the occasional Tramadol when she has a flare.  This is the first time in a while.  Pt states her sister has polymyalgia rheumatica.    Hypothyroid   We increased Levothyroxine last visit.  She feels well.  Needs TSH checked.  Palpitations evaluated by cardiology.  She does get cold easily  Relevant past medical, surgical, family and social history reviewed and updated as indicated. Interim medical history since our last visit reviewed. Allergies and medications reviewed and updated.  Review of Systems  Per HPI unless specifically indicated above     Objective:    BP 118/79   Pulse 62   Temp 98.5 F (36.9 C)   Ht 5' 2.3" (1.582 m)   Wt 157 lb 6.4 oz (71.4 kg)   LMP  (LMP Unknown)   SpO2 98%   BMI 28.51 kg/m   Wt Readings from Last 3 Encounters:  05/24/16 157 lb 6.4 oz (71.4 kg)  11/24/15 154 lb 9.6 oz (70.1 kg)  09/24/15 154 lb (69.9 kg)    Physical Exam  Constitutional: She is oriented to person, place, and time. She appears well-developed and well-nourished. No distress.  HENT:  Head: Normocephalic and atraumatic.  Eyes: Conjunctivae and lids are normal. Right eye exhibits no discharge. Left eye exhibits no discharge. No scleral icterus.  Neck: Normal range of motion. Neck supple. No JVD present. Carotid bruit is not present.  Cardiovascular: Normal rate, regular rhythm and normal heart sounds.   Pulmonary/Chest: Effort normal and breath sounds normal.   Abdominal: Normal appearance. There is no splenomegaly or hepatomegaly.  Musculoskeletal: Normal range of motion.  Neurological: She is alert and oriented to person, place, and time.  Skin: Skin is warm, dry and intact. No rash noted. No pallor.  Psychiatric: She has a normal mood and affect. Her behavior is normal. Judgment and thought content normal.    Results for orders placed or performed in visit on 11/24/15  Microscopic Examination  Result Value Ref Range   WBC, UA 0-5 0 - 5 /hpf   RBC, UA 0-2 0 - 2 /hpf   Epithelial Cells (non renal) 0-10 0 - 10 /hpf   Bacteria, UA Few None seen/Few  UA/M w/rflx Culture, Routine  Result Value Ref Range   Specific Gravity, UA 1.015 1.005 - 1.030   pH, UA 7.0 5.0 - 7.5   Color, UA Yellow Yellow   Appearance Ur Clear Clear   Leukocytes, UA Trace (A) Negative   Protein, UA Negative Negative/Trace   Glucose, UA Negative Negative   Ketones, UA Negative Negative   RBC, UA Negative Negative   Bilirubin, UA Negative Negative   Urobilinogen, Ur 0.2 0.2 - 1.0 mg/dL   Nitrite, UA Negative Negative   Microscopic Examination See below:    Urinalysis Reflex Comment  Hepatitis C antibody  Result Value Ref Range   Hep C Virus Ab <0.1 0.0 - 0.9 s/co ratio  HIV antibody  Result Value Ref Range   HIV Screen 4th Generation wRfx Non Reactive Non Reactive  Comprehensive metabolic panel  Result Value Ref Range   Glucose 75 65 - 99 mg/dL   BUN 12 6 - 24 mg/dL   Creatinine, Ser 0.73 0.57 - 1.00 mg/dL   GFR calc non Af Amer 92 >59 mL/min/1.73   GFR calc Af Amer 106 >59 mL/min/1.73   BUN/Creatinine Ratio 16 9 - 23   Sodium 138 134 - 144 mmol/L   Potassium 4.1 3.5 - 5.2 mmol/L   Chloride 95 (L) 96 - 106 mmol/L   CO2 28 18 - 29 mmol/L   Calcium 9.8 8.7 - 10.2 mg/dL   Total Protein 6.7 6.0 - 8.5 g/dL   Albumin 4.0 3.5 - 5.5 g/dL   Globulin, Total 2.7 1.5 - 4.5 g/dL   Albumin/Globulin Ratio 1.5 1.1 - 2.5   Bilirubin Total 0.5 0.0 - 1.2 mg/dL    Alkaline Phosphatase 80 39 - 117 IU/L   AST 18 0 - 40 IU/L   ALT 12 0 - 32 IU/L  CBC with Differential/Platelet  Result Value Ref Range   WBC 7.7 3.4 - 10.8 x10E3/uL   RBC 4.73 3.77 - 5.28 x10E6/uL   Hemoglobin 13.8 11.1 - 15.9 g/dL   Hematocrit 41.4 34.0 - 46.6 %   MCV 88 79 - 97 fL   MCH 29.2 26.6 - 33.0 pg   MCHC 33.3 31.5 - 35.7 g/dL   RDW 13.0 12.3 - 15.4 %   Platelets 334 150 - 379 x10E3/uL   Neutrophils 62 %   Lymphs 29 %   Monocytes 8 %   Eos 1 %   Basos 0 %   Neutrophils Absolute 4.7 1.4 - 7.0 x10E3/uL   Lymphocytes Absolute 2.2 0.7 - 3.1 x10E3/uL   Monocytes Absolute 0.6 0.1 - 0.9 x10E3/uL   EOS (ABSOLUTE) 0.0 0.0 - 0.4 x10E3/uL   Basophils Absolute 0.0 0.0 - 0.2 x10E3/uL   Immature Granulocytes 0 %   Immature Grans (Abs) 0.0 0.0 - 0.1 x10E3/uL  TSH  Result Value Ref Range   TSH 8.120 (H) 0.450 - 4.500 uIU/mL  Lipid Panel w/o Chol/HDL Ratio  Result Value Ref Range   Cholesterol, Total 205 (H) 100 - 199 mg/dL   Triglycerides 90 0 - 149 mg/dL   HDL 59 >39 mg/dL   VLDL Cholesterol Cal 18 5 - 40 mg/dL   LDL Calculated 128 (H) 0 - 99 mg/dL  Urine Culture, Routine  Result Value Ref Range   Urine Culture, Routine Final report    Urine Culture result 1 Comment       Assessment & Plan:   Problem List Items Addressed This Visit      Unprioritized   Fatigue    Check sed rate due to sister's history of PR      Fibromyalgia    Stable.  Takes the occasional Tramadol      Relevant Orders   Comprehensive metabolic panel   Sed Rate (ESR)   Hypertension    Stable, continue present medications.        Relevant Orders   Comprehensive metabolic panel   Lipid Panel w/o Chol/HDL Ratio   Hypothyroidism    Check TSH      Relevant Orders   TSH    Other Visit Diagnoses   None.  Follow up plan: Return in about 6 months (around 11/24/2016) for physical.

## 2016-05-24 NOTE — Assessment & Plan Note (Signed)
Stable, continue present medications.   

## 2016-05-24 NOTE — Assessment & Plan Note (Signed)
Check sed rate due to sister's history of PR

## 2016-05-24 NOTE — Assessment & Plan Note (Signed)
Check TSH 

## 2016-05-24 NOTE — Assessment & Plan Note (Signed)
Stable.  Takes the occasional Tramadol

## 2016-05-25 LAB — COMPREHENSIVE METABOLIC PANEL
ALBUMIN: 4.2 g/dL (ref 3.5–5.5)
ALT: 11 IU/L (ref 0–32)
AST: 15 IU/L (ref 0–40)
Albumin/Globulin Ratio: 1.6 (ref 1.2–2.2)
Alkaline Phosphatase: 81 IU/L (ref 39–117)
BILIRUBIN TOTAL: 0.5 mg/dL (ref 0.0–1.2)
BUN / CREAT RATIO: 11 (ref 9–23)
BUN: 8 mg/dL (ref 6–24)
CALCIUM: 9.8 mg/dL (ref 8.7–10.2)
CHLORIDE: 97 mmol/L (ref 96–106)
CO2: 25 mmol/L (ref 18–29)
CREATININE: 0.71 mg/dL (ref 0.57–1.00)
GFR, EST AFRICAN AMERICAN: 109 mL/min/{1.73_m2} (ref >59)
GFR, EST NON AFRICAN AMERICAN: 95 mL/min/{1.73_m2} (ref >59)
GLUCOSE: 78 mg/dL (ref 65–99)
Globulin, Total: 2.6 g/dL (ref 1.5–4.5)
Potassium: 3.8 mmol/L (ref 3.5–5.2)
Sodium: 141 mmol/L (ref 134–144)
TOTAL PROTEIN: 6.8 g/dL (ref 6.0–8.5)

## 2016-05-25 LAB — LIPID PANEL W/O CHOL/HDL RATIO
Cholesterol, Total: 199 mg/dL (ref 100–199)
HDL: 56 mg/dL (ref >39)
LDL CALC: 124 mg/dL — AB (ref 0–99)
Triglycerides: 96 mg/dL (ref 0–149)
VLDL CHOLESTEROL CAL: 19 mg/dL (ref 5–40)

## 2016-05-25 LAB — TSH: TSH: 7.23 u[IU]/mL — ABNORMAL HIGH (ref 0.450–4.500)

## 2016-05-25 LAB — SEDIMENTATION RATE: SED RATE: 2 mm/h (ref 0–40)

## 2016-06-07 ENCOUNTER — Other Ambulatory Visit: Payer: Self-pay

## 2016-06-07 ENCOUNTER — Telehealth: Payer: Self-pay

## 2016-06-07 MED ORDER — TRAMADOL HCL 50 MG PO TABS: 50.0000 mg | ORAL_TABLET | Freq: Four times a day (QID) | ORAL | 2 refills | Status: DC | PRN

## 2016-06-07 NOTE — Telephone Encounter (Signed)
Called tramadol into pharmacy

## 2016-06-17 DIAGNOSIS — I491 Atrial premature depolarization: Secondary | ICD-10-CM | POA: Diagnosis not present

## 2016-06-17 DIAGNOSIS — I890 Lymphedema, not elsewhere classified: Secondary | ICD-10-CM | POA: Diagnosis not present

## 2016-06-17 DIAGNOSIS — I10 Essential (primary) hypertension: Secondary | ICD-10-CM | POA: Diagnosis not present

## 2016-08-30 DIAGNOSIS — I890 Lymphedema, not elsewhere classified: Secondary | ICD-10-CM | POA: Diagnosis not present

## 2016-08-30 DIAGNOSIS — Z6828 Body mass index (BMI) 28.0-28.9, adult: Secondary | ICD-10-CM | POA: Diagnosis not present

## 2016-08-30 DIAGNOSIS — Z8541 Personal history of malignant neoplasm of cervix uteri: Secondary | ICD-10-CM | POA: Diagnosis not present

## 2016-08-30 DIAGNOSIS — R609 Edema, unspecified: Secondary | ICD-10-CM | POA: Diagnosis not present

## 2016-09-15 ENCOUNTER — Other Ambulatory Visit: Payer: Self-pay | Admitting: Unknown Physician Specialty

## 2016-11-10 ENCOUNTER — Other Ambulatory Visit: Payer: Self-pay | Admitting: Gynecologic Oncology

## 2016-11-10 DIAGNOSIS — Z1231 Encounter for screening mammogram for malignant neoplasm of breast: Secondary | ICD-10-CM

## 2016-11-26 ENCOUNTER — Encounter: Payer: Self-pay | Admitting: Unknown Physician Specialty

## 2016-11-26 ENCOUNTER — Ambulatory Visit (INDEPENDENT_AMBULATORY_CARE_PROVIDER_SITE_OTHER): Payer: BLUE CROSS/BLUE SHIELD | Admitting: Unknown Physician Specialty

## 2016-11-26 VITALS — BP 113/73 | HR 66 | Temp 98.7°F | Ht 62.0 in | Wt 161.0 lb

## 2016-11-26 DIAGNOSIS — M7542 Impingement syndrome of left shoulder: Secondary | ICD-10-CM | POA: Diagnosis not present

## 2016-11-26 DIAGNOSIS — Z0000 Encounter for general adult medical examination without abnormal findings: Secondary | ICD-10-CM | POA: Diagnosis not present

## 2016-11-26 DIAGNOSIS — I10 Essential (primary) hypertension: Secondary | ICD-10-CM | POA: Diagnosis not present

## 2016-11-26 DIAGNOSIS — E039 Hypothyroidism, unspecified: Secondary | ICD-10-CM | POA: Diagnosis not present

## 2016-11-26 DIAGNOSIS — M797 Fibromyalgia: Secondary | ICD-10-CM

## 2016-11-26 DIAGNOSIS — K219 Gastro-esophageal reflux disease without esophagitis: Secondary | ICD-10-CM | POA: Diagnosis not present

## 2016-11-26 DIAGNOSIS — R5383 Other fatigue: Secondary | ICD-10-CM | POA: Diagnosis not present

## 2016-11-26 MED ORDER — TRAMADOL HCL 50 MG PO TABS: 50.0000 mg | ORAL_TABLET | Freq: Four times a day (QID) | ORAL | 2 refills | Status: DC | PRN

## 2016-11-26 MED ORDER — CYCLOBENZAPRINE HCL 10 MG PO TABS: 10.0000 mg | ORAL_TABLET | Freq: Every day | ORAL | 3 refills | Status: DC

## 2016-11-26 NOTE — Assessment & Plan Note (Signed)
Discussed diet modifications.  Start Omeprazole

## 2016-11-26 NOTE — Assessment & Plan Note (Signed)
Stable, continue present medications.  Encouraged Flexeril in HS

## 2016-11-26 NOTE — Patient Instructions (Addendum)
Shoulder Impingement Syndrome Shoulder impingement syndrome is a condition that causes pain when connective tissues (tendons) surrounding the shoulder joint become pinched. These tendons are part of the group of muscles and tissues that help to stabilize the shoulder (rotator cuff). Beneath the rotator cuff is a fluid-filled sac (bursa) that allows the muscles and tendons to glide smoothly. The bursa may become swollen or irritated (bursitis). Bursitis, swelling in the rotator cuff tendons, or both conditions can decrease how much space is under a bone in the shoulder joint (acromion), resulting in impingement. What are the causes? Shoulder impingement syndrome can be caused by bursitis or swelling of the rotator cuff tendons, which may result from:  Repetitive overhead arm movements.  Falling onto the shoulder.  Weakness in the shoulder muscles. What increases the risk? You may be more likely to develop this condition if you are an athlete who participates in:  Sports that involve throwing, such as baseball.  Tennis.  Swimming.  Volleyball. Some people are also more likely to develop impingement syndrome because of the shape of their acromion bone. What are the signs or symptoms? The main symptom of this condition is pain on the front or side of the shoulder. Pain may:  Get worse when lifting or raising the arm.  Get worse at night.  Wake you up from sleeping.  Feel sharp when the shoulder is moved, and then fade to an ache. Other signs and symptoms may include:  Tenderness.  Stiffness.  Inability to raise the arm above shoulder level or behind the body.  Weakness. How is this diagnosed? This condition may be diagnosed based on:  Your symptoms.  Your medical history.  A physical exam.  Imaging tests, such as:  X-rays.  MRI.  Ultrasound. How is this treated? Treatment for this condition may include:  Resting your shoulder and avoiding all activities that  cause pain or put stress on the shoulder.  Icing your shoulder.  NSAIDs to help reduce pain and swelling.  One or more injections of medicines to numb the area and reduce inflammation.  Physical therapy.  Surgery. This may be needed if nonsurgical treatments have not helped. Surgery may involve repairing the rotator cuff, reshaping the acromion, or removing the bursa. Follow these instructions at home: Managing pain, stiffness, and swelling  If directed, apply ice to the injured area.  Put ice in a plastic bag.  Place a towel between your skin and the bag.  Leave the ice on for 20 minutes, 2-3 times a day. Activity  Rest and return to your normal activities as told by your health care provider. Ask your health care provider what activities are safe for you.  Do exercises as told by your health care provider. General instructions  Do not use any tobacco products, including cigarettes, chewing tobacco, or e-cigarettes. Tobacco can delay healing. If you need help quitting, ask your health care provider.  Ask your health care provider when it is safe for you to drive.  Take over-the-counter and prescription medicines only as told by your health care provider.  Keep all follow-up visits as told by your health care provider. This is important. How is this prevented?  Give your body time to rest between periods of activity.  Be safe and responsible while being active to avoid falls.  Maintain physical fitness, including strength and flexibility. Contact a health care provider if:  Your symptoms have not improved after 1-2 months of treatment and rest.  You cannot lift your  arm away from your body. This information is not intended to replace advice given to you by your health care provider. Make sure you discuss any questions you have with your health care provider. Document Released: 10/18/2005 Document Revised: 06/24/2016 Document Reviewed: 09/20/2015 Elsevier Interactive  Patient Education  2017 [place].  Shoulder Impingement Syndrome Rehab Ask your health care provider which exercises are safe for you. Do exercises exactly as told by your health care provider and adjust them as directed. It is normal to feel mild stretching, pulling, tightness, or discomfort as you do these exercises, but you should stop right away if you feel sudden pain or your pain gets worse.Do not begin these exercises until told by your health care provider. Stretching and range of motion exercise This exercise warms up your muscles and joints and improves the movement and flexibility of your shoulder. This exercise also helps to relieve pain and stiffness. Exercise A: Passive horizontal adduction 1. Sit or stand and pull your left / right elbow across your chest, toward your other shoulder. Stop when you feel a gentle stretch in the back of your shoulder and upper arm.  Keep your arm at shoulder height.  Keep your arm as close to your body as you comfortably can. 2. Hold for __________ seconds. 3. Slowly return to the starting position. Repeat __________ times. Complete this exercise __________ times a day. Strengthening exercises These exercises build strength and endurance in your shoulder. Endurance is the ability to use your muscles for a long time, even after they get tired. Exercise B: External rotation, isometric 1. Stand or sit in a doorway, facing the door frame. 2. Bend your left / right elbow and place the back of your wrist against the door frame. Only your wrist should be touching the frame. Keep your upper arm at your side. 3. Gently press your wrist against the door frame, as if you are trying to push your arm away from your abdomen.  Avoid shrugging your shoulder while you press your hand against the door frame. Keep your shoulder blade tucked down toward the middle of your back. 4. Hold for __________ seconds. 5. Slowly release the tension, and relax your  muscles completely before you do the exercise again. Repeat __________ times. Complete this exercise __________ times a day. Exercise C: Internal rotation, isometric 1. Stand or sit in a doorway, facing the door frame. 2. Bend your left / right elbow and place the inside of your wrist against the door frame. Only your wrist should be touching the frame. Keep your upper arm at your side. 3. Gently press your wrist against the door frame, as if you are trying to push your arm toward your abdomen.  Avoid shrugging your shoulder while you press your hand against the door frame. Keep your shoulder blade tucked down toward the middle of your back. 4. Hold for __________ seconds. 5. Slowly release the tension, and relax your muscles completely before you do the exercise again. Repeat __________ times. Complete this exercise __________ times a day. Exercise D: Scapular protraction, supine 1. Lie on your back on a firm surface. Hold a __________ weight in your left / right hand. 2. Raise your left / right arm straight into the air so your hand is directly above your shoulder joint. 3. Push the weight into the air so your shoulder lifts off of the surface that you are lying on. Do not move your head, neck, or back. 4. Hold for __________ seconds.  5. Slowly return to the starting position. Let your muscles relax completely before you repeat this exercise. Repeat __________ times. Complete this exercise __________ times a day. Exercise E: Scapular retraction 1. Sit in a stable chair without armrests, or stand. 2. Secure an exercise band to a stable object in front of you so the band is at shoulder height. 3. Hold one end of the exercise band in each hand. Your palms should face down. 4. Squeeze your shoulder blades together and move your elbows slightly behind you. Do not shrug your shoulders while you do this. 5. Hold for __________ seconds. 6. Slowly return to the starting position. Repeat __________  times. Complete this exercise __________ times a day. Exercise F: Shoulder extension 1. Sit in a stable chair without armrests, or stand. 2. Secure an exercise band to a stable object in front of you where the band is above shoulder height. 3. Hold one end of the exercise band in each hand. 4. Straighten your elbows and lift your hands up to shoulder height. 5. Squeeze your shoulder blades together and pull your hands down to the sides of your thighs. Stop when your hands are straight down by your sides. Do not let your hands go behind your body. 6. Hold for __________ seconds. 7. Slowly return to the starting position. Repeat __________ times. Complete this exercise __________ times a day. This information is not intended to replace advice given to you by your health care provider. Make sure you discuss any questions you have with your health care provider. Document Released: 10/18/2005 Document Revised: 06/24/2016 Document Reviewed: 09/20/2015 Elsevier Interactive Patient Education  2017 [place] Maintenance, Female Introduction Adopting a healthy lifestyle and getting preventive care can go a long way to promote health and wellness. Talk with your health care provider about what schedule of regular examinations is right for you. This is a good chance for you to check in with your provider about disease prevention and staying healthy. In between checkups, there are plenty of things you can do on your own. Experts have done a lot of research about which lifestyle changes and preventive measures are most likely to keep you healthy. Ask your health care provider for more information. Weight and diet Eat a healthy diet  Be sure to include plenty of vegetables, fruits, low-fat dairy products, and lean protein.  Do not eat a lot of foods high in solid fats, added sugars, or salt.  Get regular exercise. This is one of the most important things you can do for your health.  Most  adults should exercise for at least 150 minutes each week. The exercise should increase your heart rate and make you sweat (moderate-intensity exercise).  Most adults should also do strengthening exercises at least twice a week. This is in addition to the moderate-intensity exercise. Maintain a healthy weight  Body mass index (BMI) is a measurement that can be used to identify possible weight problems. It estimates body fat based on height and weight. Your health care provider can help determine your BMI and help you achieve or maintain a healthy weight.  For females 75 years of age and older:  A BMI below 18.5 is considered underweight.  A BMI of 18.5 to 24.9 is normal.  A BMI of 25 to 29.9 is considered overweight.  A BMI of 30 and above is considered obese. Watch levels of cholesterol and blood lipids  You should start having your blood tested for lipids and cholesterol at  58 years of age, then have this test every 5 years.  You may need to have your cholesterol levels checked more often if:  Your lipid or cholesterol levels are high.  You are older than 59 years of age.  You are at high risk for heart disease. Cancer screening Lung Cancer  Lung cancer screening is recommended for adults 48-67 years old who are at high risk for lung cancer because of a history of smoking.  A yearly low-dose CT scan of the lungs is recommended for people who:  Currently smoke.  Have quit within the past 15 years.  Have at least a 30-pack-year history of smoking. A pack year is smoking an average of one pack of cigarettes a day for 1 year.  Yearly screening should continue until it has been 15 years since you quit.  Yearly screening should stop if you develop a health problem that would prevent you from having lung cancer treatment. Breast Cancer  Practice breast self-awareness. This means understanding how your breasts normally appear and feel.  It also means doing regular breast  self-exams. Let your health care provider know about any changes, no matter how small.  If you are in your 20s or 30s, you should have a clinical breast exam (CBE) by a health care provider every 1-3 years as part of a regular health exam.  If you are 75 or older, have a CBE every year. Also consider having a breast X-ray (mammogram) every year.  If you have a family history of breast cancer, talk to your health care provider about genetic screening.  If you are at high risk for breast cancer, talk to your health care provider about having an MRI and a mammogram every year.  Breast cancer gene (BRCA) assessment is recommended for women who have family members with BRCA-related cancers. BRCA-related cancers include:  Breast.  Ovarian.  Tubal.  Peritoneal cancers.  Results of the assessment will determine the need for genetic counseling and BRCA1 and BRCA2 testing. Cervical Cancer  Your health care provider may recommend that you be screened regularly for cancer of the pelvic organs (ovaries, uterus, and vagina). This screening involves a pelvic examination, including checking for microscopic changes to the surface of your cervix (Pap test). You may be encouraged to have this screening done every 3 years, beginning at age 73.  For women ages 58-65, health care providers may recommend pelvic exams and Pap testing every 3 years, or they may recommend the Pap and pelvic exam, combined with testing for human papilloma virus (HPV), every 5 years. Some types of HPV increase your risk of cervical cancer. Testing for HPV may also be done on women of any age with unclear Pap test results.  Other health care providers may not recommend any screening for nonpregnant women who are considered low risk for pelvic cancer and who do not have symptoms. Ask your health care provider if a screening pelvic exam is right for you.  If you have had past treatment for cervical cancer or a condition that could lead  to cancer, you need Pap tests and screening for cancer for at least 20 years after your treatment. If Pap tests have been discontinued, your risk factors (such as having a new sexual partner) need to be reassessed to determine if screening should resume. Some women have medical problems that increase the chance of getting cervical cancer. In these cases, your health care provider may recommend more frequent screening and Pap tests. Colorectal  Cancer  This type of cancer can be detected and often prevented.  Routine colorectal cancer screening usually begins at 59 years of age and continues through 59 years of age.  Your health care provider may recommend screening at an earlier age if you have risk factors for colon cancer.  Your health care provider may also recommend using home test kits to check for hidden blood in the stool.  A small camera at the end of a tube can be used to examine your colon directly (sigmoidoscopy or colonoscopy). This is done to check for the earliest forms of colorectal cancer.  Routine screening usually begins at age 27.  Direct examination of the colon should be repeated every 5-10 years through 59 years of age. However, you may need to be screened more often if early forms of precancerous polyps or small growths are found. Skin Cancer  Check your skin from head to toe regularly.  Tell your health care provider about any new moles or changes in moles, especially if there is a change in a mole's shape or color.  Also tell your health care provider if you have a mole that is larger than the size of a pencil eraser.  Always use sunscreen. Apply sunscreen liberally and repeatedly throughout the day.  Protect yourself by wearing long sleeves, pants, a wide-brimmed hat, and sunglasses whenever you are outside. Heart disease, diabetes, and high blood pressure  High blood pressure causes heart disease and increases the risk of stroke. High blood pressure is more  likely to develop in:  People who have blood pressure in the high end of the normal range (130-139/85-89 mm Hg).  People who are overweight or obese.  People who are African American.  If you are 63-67 years of age, have your blood pressure checked every 3-5 years. If you are 13 years of age or older, have your blood pressure checked every year. You should have your blood pressure measured twice-once when you are at a hospital or clinic, and once when you are not at a hospital or clinic. Record the average of the two measurements. To check your blood pressure when you are not at a hospital or clinic, you can use:  An automated blood pressure machine at a pharmacy.  A home blood pressure monitor.  If you are between 33 years and 48 years old, ask your health care provider if you should take aspirin to prevent strokes.  Have regular diabetes screenings. This involves taking a blood sample to check your fasting blood sugar level.  If you are at a normal weight and have a low risk for diabetes, have this test once every three years after 59 years of age.  If you are overweight and have a high risk for diabetes, consider being tested at a younger age or more often. Preventing infection Hepatitis B  If you have a higher risk for hepatitis B, you should be screened for this virus. You are considered at high risk for hepatitis B if:  You were born in a country where hepatitis B is common. Ask your health care provider which countries are considered high risk.  Your parents were born in a high-risk country, and you have not been immunized against hepatitis B (hepatitis B vaccine).  You have HIV or AIDS.  You use needles to inject street drugs.  You live with someone who has hepatitis B.  You have had sex with someone who has hepatitis B.  You get hemodialysis treatment.  You take certain medicines for conditions, including cancer, organ transplantation, and autoimmune  conditions. Hepatitis C  Blood testing is recommended for:  Everyone born from [birth year] through 1963.  Anyone with known risk factors for hepatitis C. Sexually transmitted infections (STIs)  You should be screened for sexually transmitted infections (STIs) including gonorrhea and chlamydia if:  You are sexually active and are younger than 58 years of age.  You are older than 59 years of age and your health care provider tells you that you are at risk for this type of infection.  Your sexual activity has changed since you were last screened and you are at an increased risk for chlamydia or gonorrhea. Ask your health care provider if you are at risk.  If you do not have HIV, but are at risk, it may be recommended that you take a prescription medicine daily to prevent HIV infection. This is called pre-exposure prophylaxis (PrEP). You are considered at risk if:  You are sexually active and do not regularly use condoms or know the HIV status of your partner(s).  You take drugs by injection.  You are sexually active with a partner who has HIV. Talk with your health care provider about whether you are at high risk of being infected with HIV. If you choose to begin PrEP, you should first be tested for HIV. You should then be tested every 3 months for as long as you are taking PrEP. Pregnancy  If you are premenopausal and you may become pregnant, ask your health care provider about preconception counseling.  If you may become pregnant, take 400 to 800 micrograms (mcg) of folic acid every day.  If you want to prevent pregnancy, talk to your health care provider about birth control (contraception). Osteoporosis and menopause  Osteoporosis is a disease in which the bones lose minerals and strength with aging. This can result in serious bone fractures. Your risk for osteoporosis can be identified using a bone density scan.  If you are 61 years of age or older, or if you are at risk for  osteoporosis and fractures, ask your health care provider if you should be screened.  Ask your health care provider whether you should take a calcium or vitamin D supplement to lower your risk for osteoporosis.  Menopause may have certain physical symptoms and risks.  Hormone replacement therapy may reduce some of these symptoms and risks. Talk to your health care provider about whether hormone replacement therapy is right for you. Follow these instructions at home:  Schedule regular health, dental, and eye exams.  Stay current with your immunizations.  Do not use any tobacco products including cigarettes, chewing tobacco, or electronic cigarettes.  If you are pregnant, do not drink alcohol.  If you are breastfeeding, limit how much and how often you drink alcohol.  Limit alcohol intake to no more than 1 drink per day for nonpregnant women. One drink equals 12 ounces of beer, 5 ounces of wine, or 1 ounces of hard liquor.  Do not use street drugs.  Do not share needles.  Ask your health care provider for help if you need support or information about quitting drugs.  Tell your health care provider if you often feel depressed.  Tell your health care provider if you have ever been abused or do not feel safe at home. This information is not intended to replace advice given to you by your health care provider. Make sure you discuss any questions you have with your  health care provider. Document Released: 05/03/2011 Document Revised: 03/25/2016 Document Reviewed: 07/22/2015  2017 [employer]

## 2016-11-26 NOTE — Assessment & Plan Note (Signed)
Will schedule cortisone shot at pt request.  Not interested in PT

## 2016-11-26 NOTE — Progress Notes (Signed)
BP 113/73 (BP Location: Left Arm, Patient Position: Sitting, Cuff Size: Large)   Pulse 66   Temp 98.7 F (37.1 C)   Ht '5\' 2"'$  (1.575 m)   Wt 161 lb (73 kg)   LMP  (LMP Unknown)   SpO2 97%   BMI 29.45 kg/m    Subjective:    Patient ID: Robin Soto, female    DOB: 08/29/1956, 61 y.o.   MRN: [id number]  HPI: Robin Soto is a 61 y.o. female  Chief Complaint  Patient presents with  . Annual Exam  . Shoulder Pain    pt states her left shoulder has been hurting for about 3 weeks  . Medication Refill    pt states she needs a refill on tramadol   Left shoulder Pain for 3 weeks that comes and goes.  She feels it's the Fibromyalgia.  Also having bilateral knee pain.  She would like a refill of the Tramadol.  Does not wake up rested.  #90 Tramadol lasts about 6 months   GERD Burning with certain foods.    Social History   Social History  . Marital status: Single    Spouse name: N/A  . Number of children: N/A  . Years of education: N/A   Occupational History  . Not on file.   Social History Main Topics  . Smoking status: Never Smoker  . Smokeless tobacco: Never Used  . Alcohol use 0.0 oz/week     Comment: glass of wine occasionally  . Drug use: No  . Sexual activity: Yes     Comment: pt states she is with only one person (16 years)   Other Topics Concern  . Not on file   Social History Narrative  . No narrative on file   Family History  Problem Relation Age of Onset  . Lung disease Mother   . Arthritis Mother   . Osteoporosis Mother   . Heart disease Father     MI  . Arthritis Father   . Osteoporosis Sister   . COPD Son   . Diabetes Maternal Grandmother   . Cancer Paternal Grandmother     breast  . Breast cancer Paternal Grandmother 34  . CAD Paternal Grandfather    Past Medical History:  Diagnosis Date  . Allergic rhinitis   . Cancer (HCC)    cervical  . IBS (irritable bowel syndrome)   . Lumbago   . Lymphedema   . Myalgia   . Thyroid  disease    Past Surgical History:  Procedure Laterality Date  . ABDOMINAL HYSTERECTOMY       Relevant past medical, surgical, family and social history reviewed and updated as indicated. Interim medical history since our last visit reviewed. Allergies and medications reviewed and updated.  Review of Systems  Constitutional: Negative.   HENT: Negative.   Eyes: Negative.   Respiratory: Negative.   Cardiovascular: Negative.   Genitourinary: Negative.   Psychiatric/Behavioral: Negative.     Per HPI unless specifically indicated above     Objective:    BP 113/73 (BP Location: Left Arm, Patient Position: Sitting, Cuff Size: Large)   Pulse 66   Temp 98.7 F (37.1 C)   Ht '5\' 2"'$  (1.575 m)   Wt 161 lb (73 kg)   LMP  (LMP Unknown)   SpO2 97%   BMI 29.45 kg/m   Wt Readings from Last 3 Encounters:  11/26/16 161 lb (73 kg)  05/24/16 157 lb 6.4 oz (71.4 kg)  11/24/15 154 lb 9.6 oz (70.1 kg)    Physical Exam  Constitutional: She is oriented to person, place, and time. She appears well-developed and well-nourished. No distress.  HENT:  Head: Normocephalic and atraumatic.  Eyes: Conjunctivae and lids are normal. Right eye exhibits no discharge. Left eye exhibits no discharge. No scleral icterus.  Neck: Normal range of motion. Neck supple. No JVD present. Carotid bruit is not present.  Cardiovascular: Normal rate, regular rhythm and normal heart sounds.   Pulmonary/Chest: Effort normal and breath sounds normal.  Abdominal: Normal appearance. There is no splenomegaly or hepatomegaly.  Musculoskeletal: Normal range of motion.  Neurological: She is alert and oriented to person, place, and time.  Skin: Skin is warm, dry and intact. No rash noted. No pallor.  Psychiatric: She has a normal mood and affect. Her behavior is normal. Judgment and thought content normal.    Results for orders placed or performed in visit on 05/24/16  Comprehensive metabolic panel  Result Value Ref Range     Glucose 78 65 - 99 mg/dL   BUN 8 6 - 24 mg/dL   Creatinine, Ser 0.71 0.57 - 1.00 mg/dL   GFR calc non Af Amer 95 >59 mL/min/1.73   GFR calc Af Amer 109 >59 mL/min/1.73   BUN/Creatinine Ratio 11 9 - 23   Sodium 141 134 - 144 mmol/L   Potassium 3.8 3.5 - 5.2 mmol/L   Chloride 97 96 - 106 mmol/L   CO2 25 18 - 29 mmol/L   Calcium 9.8 8.7 - 10.2 mg/dL   Total Protein 6.8 6.0 - 8.5 g/dL   Albumin 4.2 3.5 - 5.5 g/dL   Globulin, Total 2.6 1.5 - 4.5 g/dL   Albumin/Globulin Ratio 1.6 1.2 - 2.2   Bilirubin Total 0.5 0.0 - 1.2 mg/dL   Alkaline Phosphatase 81 39 - 117 IU/L   AST 15 0 - 40 IU/L   ALT 11 0 - 32 IU/L  TSH  Result Value Ref Range   TSH 7.230 (H) 0.450 - 4.500 uIU/mL  Lipid Panel w/o Chol/HDL Ratio  Result Value Ref Range   Cholesterol, Total 199 100 - 199 mg/dL   Triglycerides 96 0 - 149 mg/dL   HDL 56 >39 mg/dL   VLDL Cholesterol Cal 19 5 - 40 mg/dL   LDL Calculated 124 (H) 0 - 99 mg/dL  Sed Rate (ESR)  Result Value Ref Range   Sed Rate 2 0 - 40 mm/hr      Assessment & Plan:   Problem List Items Addressed This Visit      Unprioritized   Fatigue   Relevant Orders   CBC with Differential/Platelet   Fibromyalgia - Primary    Stable, continue present medications.  Encouraged Flexeril in HS       Relevant Orders   VITAMIN D 25 Hydroxy (Vit-D Deficiency, Fractures)   CBC with Differential/Platelet   Vitamin B12   Gastroesophageal reflux disease without esophagitis    Discussed diet modifications.  Start Omeprazole      Hypertension    Stable, continue present medications.        Relevant Orders   Comprehensive metabolic panel   Lipid Panel w/o Chol/HDL Ratio   Hypothyroidism    Check TSH      Relevant Orders   TSH   Shoulder impingement syndrome, left    Will schedule cortisone shot at pt request.  Not interested in PT       Other Visit Diagnoses  Routine general medical examination at a health care facility       Relevant Orders    Cologuard       Follow up plan: Return in about 6 months (around 05/26/2017) for plus schedule shoulder injection.

## 2016-11-26 NOTE — Assessment & Plan Note (Signed)
Check TSH 

## 2016-11-26 NOTE — Assessment & Plan Note (Signed)
Stable, continue present medications.   

## 2016-11-27 LAB — CBC WITH DIFFERENTIAL/PLATELET
Basophils Absolute: 0 10*3/uL (ref 0.0–0.2)
Basos: 0 %
EOS (ABSOLUTE): 0.1 10*3/uL (ref 0.0–0.4)
EOS: 1 %
HEMATOCRIT: 41.4 % (ref 34.0–46.6)
HEMOGLOBIN: 13.8 g/dL (ref 11.1–15.9)
Immature Grans (Abs): 0 10*3/uL (ref 0.0–0.1)
Immature Granulocytes: 0 %
LYMPHS ABS: 1.9 10*3/uL (ref 0.7–3.1)
Lymphs: 28 %
MCH: 29.8 pg (ref 26.6–33.0)
MCHC: 33.3 g/dL (ref 31.5–35.7)
MCV: 89 fL (ref 79–97)
MONOS ABS: 0.6 10*3/uL (ref 0.1–0.9)
Monocytes: 8 %
Neutrophils Absolute: 4 10*3/uL (ref 1.4–7.0)
Neutrophils: 63 %
Platelets: 331 10*3/uL (ref 150–379)
RBC: 4.63 x10E6/uL (ref 3.77–5.28)
RDW: 12.9 % (ref 12.3–15.4)
WBC: 6.6 10*3/uL (ref 3.4–10.8)

## 2016-11-27 LAB — COMPREHENSIVE METABOLIC PANEL
ALBUMIN: 4.2 g/dL (ref 3.5–5.5)
ALT: 10 IU/L (ref 0–32)
AST: 17 IU/L (ref 0–40)
Albumin/Globulin Ratio: 1.6 (ref 1.2–2.2)
Alkaline Phosphatase: 77 IU/L (ref 39–117)
BUN / CREAT RATIO: 15 (ref 9–23)
BUN: 11 mg/dL (ref 6–24)
Bilirubin Total: 0.5 mg/dL (ref 0.0–1.2)
CO2: 26 mmol/L (ref 18–29)
CREATININE: 0.73 mg/dL (ref 0.57–1.00)
Calcium: 9.9 mg/dL (ref 8.7–10.2)
Chloride: 95 mmol/L — ABNORMAL LOW (ref 96–106)
GFR calc non Af Amer: 91 mL/min/{1.73_m2} (ref >59)
GFR, EST AFRICAN AMERICAN: 105 mL/min/{1.73_m2} (ref >59)
GLUCOSE: 91 mg/dL (ref 65–99)
Globulin, Total: 2.6 g/dL (ref 1.5–4.5)
Potassium: 4.2 mmol/L (ref 3.5–5.2)
Sodium: 140 mmol/L (ref 134–144)
TOTAL PROTEIN: 6.8 g/dL (ref 6.0–8.5)

## 2016-11-27 LAB — LIPID PANEL W/O CHOL/HDL RATIO
CHOLESTEROL TOTAL: 210 mg/dL — AB (ref 100–199)
HDL: 57 mg/dL (ref >39)
LDL CALC: 140 mg/dL — AB (ref 0–99)
Triglycerides: 64 mg/dL (ref 0–149)
VLDL CHOLESTEROL CAL: 13 mg/dL (ref 5–40)

## 2016-11-27 LAB — VITAMIN B12: Vitamin B-12: 413 pg/mL (ref 232–1245)

## 2016-11-27 LAB — VITAMIN D 25 HYDROXY (VIT D DEFICIENCY, FRACTURES): VIT D 25 HYDROXY: 31.2 ng/mL (ref 30.0–100.0)

## 2016-11-27 LAB — TSH: TSH: 1.99 u[IU]/mL (ref 0.450–4.500)

## 2016-12-03 ENCOUNTER — Ambulatory Visit
Admission: RE | Admit: 2016-12-03 | Discharge: 2016-12-03 | Disposition: A | Discharge status: Home / Self Care | Payer: BLUE CROSS/BLUE SHIELD | Source: Ambulatory Visit | Attending: Gynecologic Oncology | Admitting: Gynecologic Oncology

## 2016-12-03 DIAGNOSIS — Z1231 Encounter for screening mammogram for malignant neoplasm of breast: Secondary | ICD-10-CM | POA: Diagnosis not present

## 2017-01-11 ENCOUNTER — Other Ambulatory Visit: Payer: Self-pay | Admitting: Unknown Physician Specialty

## 2017-04-20 ENCOUNTER — Other Ambulatory Visit: Payer: Self-pay | Admitting: Unknown Physician Specialty

## 2017-05-27 ENCOUNTER — Ambulatory Visit: Payer: BLUE CROSS/BLUE SHIELD | Admitting: Unknown Physician Specialty

## 2017-06-06 ENCOUNTER — Encounter: Payer: Self-pay | Admitting: Unknown Physician Specialty

## 2017-06-06 ENCOUNTER — Ambulatory Visit (INDEPENDENT_AMBULATORY_CARE_PROVIDER_SITE_OTHER): Payer: BLUE CROSS/BLUE SHIELD | Admitting: Unknown Physician Specialty

## 2017-06-06 VITALS — BP 110/75 | HR 65 | Temp 98.2°F | Ht 61.3 in | Wt 168.7 lb

## 2017-06-06 DIAGNOSIS — K219 Gastro-esophageal reflux disease without esophagitis: Secondary | ICD-10-CM

## 2017-06-06 DIAGNOSIS — I10 Essential (primary) hypertension: Secondary | ICD-10-CM | POA: Diagnosis not present

## 2017-06-06 DIAGNOSIS — E039 Hypothyroidism, unspecified: Secondary | ICD-10-CM

## 2017-06-06 MED ORDER — OMEPRAZOLE 20 MG PO CPDR: 20.0000 mg | DELAYED_RELEASE_CAPSULE | Freq: Every day | ORAL | 3 refills | Status: DC

## 2017-06-06 NOTE — Progress Notes (Addendum)
BP 110/75   Pulse 65   Temp 98.2 F (36.8 C)   Ht 5' 1.3" (1.557 m)   Wt 168 lb 11.2 oz (76.5 kg)   LMP  (LMP Unknown)   SpO2 98%   BMI 31.56 kg/m    Subjective:    Patient ID: Robin Soto, female    DOB: 02/16/1956, 61 y.o.   MRN: [id number]  HPI: Robin Soto is a 61 y.o. female  Chief Complaint  Patient presents with  . Fibromyalgia  . Hypertension  . Hypothyroidism   GERD Pt states she frequently gets indigestion.  She reports it as "burning" with food and medication.  Taking Zantac OTC BID without relief.    Hypothyroid Some recent weight gain but was eating out.    Hypertension Using medications without difficulty Average home BPs Not checking   No problems or lightheadedness No chest pain with exertion or shortness of breath No Edema  The 10-year ASCVD risk score Mikey Bussing DC Jr., et al., 2013) is: 2.9%   Values used to calculate the score:     Age: 65 years     Sex: Female     Is Non-Hispanic African American: No     Diabetic: No     Tobacco smoker: No     Systolic Blood Pressure: 937 mmHg     Is BP treated: Yes     HDL Cholesterol: 50 mg/dL     Total Cholesterol: 200 mg/dL   Relevant past medical, surgical, family and social history reviewed and updated as indicated. Interim medical history since our last visit reviewed. Allergies and medications reviewed and updated.  Review of Systems  Musculoskeletal:       Complaints of shoulder pain have resolved.  Complaining of various aches in back and legs    Per HPI unless specifically indicated above     Objective:    BP 110/75   Pulse 65   Temp 98.2 F (36.8 C)   Ht 5' 1.3" (1.557 m)   Wt 168 lb 11.2 oz (76.5 kg)   LMP  (LMP Unknown)   SpO2 98%   BMI 31.56 kg/m   Wt Readings from Last 3 Encounters:  06/06/17 168 lb 11.2 oz (76.5 kg)  11/26/16 161 lb (73 kg)  05/24/16 157 lb 6.4 oz (71.4 kg)    Physical Exam  Constitutional: She is oriented to person, place, and time. She appears  well-developed and well-nourished. No distress.  HENT:  Head: Normocephalic and atraumatic.  Eyes: Conjunctivae and lids are normal. Right eye exhibits no discharge. Left eye exhibits no discharge. No scleral icterus.  Neck: Normal range of motion. Neck supple. No JVD present. Carotid bruit is not present.  Cardiovascular: Normal rate, regular rhythm and normal heart sounds.   Pulmonary/Chest: Effort normal and breath sounds normal.  Abdominal: Normal appearance. There is no splenomegaly or hepatomegaly.  Musculoskeletal: Normal range of motion.  Neurological: She is alert and oriented to person, place, and time.  Skin: Skin is warm, dry and intact. No rash noted. No pallor.  Psychiatric: She has a normal mood and affect. Her behavior is normal. Judgment and thought content normal.    Results for orders placed or performed in visit on 11/26/16  Comprehensive metabolic panel  Result Value Ref Range   Glucose 91 65 - 99 mg/dL   BUN 11 6 - 24 mg/dL   Creatinine, Ser 0.73 0.57 - 1.00 mg/dL   GFR calc non Af Amer 91 >59  mL/min/1.73   GFR calc Af Amer 105 >59 mL/min/1.73   BUN/Creatinine Ratio 15 9 - 23   Sodium 140 134 - 144 mmol/L   Potassium 4.2 3.5 - 5.2 mmol/L   Chloride 95 (L) 96 - 106 mmol/L   CO2 26 18 - 29 mmol/L   Calcium 9.9 8.7 - 10.2 mg/dL   Total Protein 6.8 6.0 - 8.5 g/dL   Albumin 4.2 3.5 - 5.5 g/dL   Globulin, Total 2.6 1.5 - 4.5 g/dL   Albumin/Globulin Ratio 1.6 1.2 - 2.2   Bilirubin Total 0.5 0.0 - 1.2 mg/dL   Alkaline Phosphatase 77 39 - 117 IU/L   AST 17 0 - 40 IU/L   ALT 10 0 - 32 IU/L  Lipid Panel w/o Chol/HDL Ratio  Result Value Ref Range   Cholesterol, Total 210 (H) 100 - 199 mg/dL   Triglycerides 64 0 - 149 mg/dL   HDL 57 >39 mg/dL   VLDL Cholesterol Cal 13 5 - 40 mg/dL   LDL Calculated 140 (H) 0 - 99 mg/dL  TSH  Result Value Ref Range   TSH 1.990 0.450 - 4.500 uIU/mL  VITAMIN D 25 Hydroxy (Vit-D Deficiency, Fractures)  Result Value Ref Range   Vit  D, 25-Hydroxy 31.2 30.0 - 100.0 ng/mL  CBC with Differential/Platelet  Result Value Ref Range   WBC 6.6 3.4 - 10.8 x10E3/uL   RBC 4.63 3.77 - 5.28 x10E6/uL   Hemoglobin 13.8 11.1 - 15.9 g/dL   Hematocrit 41.4 34.0 - 46.6 %   MCV 89 79 - 97 fL   MCH 29.8 26.6 - 33.0 pg   MCHC 33.3 31.5 - 35.7 g/dL   RDW 12.9 12.3 - 15.4 %   Platelets 331 150 - 379 x10E3/uL   Neutrophils 63 Not Estab. %   Lymphs 28 Not Estab. %   Monocytes 8 Not Estab. %   Eos 1 Not Estab. %   Basos 0 Not Estab. %   Neutrophils Absolute 4.0 1.4 - 7.0 x10E3/uL   Lymphocytes Absolute 1.9 0.7 - 3.1 x10E3/uL   Monocytes Absolute 0.6 0.1 - 0.9 x10E3/uL   EOS (ABSOLUTE) 0.1 0.0 - 0.4 x10E3/uL   Basophils Absolute 0.0 0.0 - 0.2 x10E3/uL   Immature Granulocytes 0 Not Estab. %   Immature Grans (Abs) 0.0 0.0 - 0.1 x10E3/uL  Vitamin B12  Result Value Ref Range   Vitamin B-12 413 232 - 1,245 pg/mL      Assessment & Plan:   Problem List Items Addressed This Visit      Unprioritized   GERD (gastroesophageal reflux disease)    New problem.  Start Omeprazole 20 mg daily      Relevant Medications   omeprazole (PRILOSEC) 20 MG capsule   Hypertension    Stable, continue present medications.        Relevant Medications   spironolactone-hydrochlorothiazide (ALDACTAZIDE) 25-25 MG tablet   Other Relevant Orders   Comprehensive metabolic panel   Lipid Panel w/o Chol/HDL Ratio   Hypothyroidism - Primary    Recent weight gain.  Check thyroid      Relevant Orders   TSH       Follow up plan: Return in about 6 months (around 12/07/2017) for physical.

## 2017-06-06 NOTE — Assessment & Plan Note (Addendum)
Recent weight gain.  Check thyroid

## 2017-06-06 NOTE — Assessment & Plan Note (Signed)
Stable, continue present medications.   

## 2017-06-06 NOTE — Assessment & Plan Note (Addendum)
New problem.  Start Omeprazole 20 mg daily.   

## 2017-06-07 ENCOUNTER — Other Ambulatory Visit: Payer: Self-pay | Admitting: Unknown Physician Specialty

## 2017-06-07 LAB — LIPID PANEL W/O CHOL/HDL RATIO
CHOLESTEROL TOTAL: 200 mg/dL — AB (ref 100–199)
HDL: 50 mg/dL (ref >39)
LDL Calculated: 130 mg/dL — ABNORMAL HIGH (ref 0–99)
TRIGLYCERIDES: 102 mg/dL (ref 0–149)
VLDL Cholesterol Cal: 20 mg/dL (ref 5–40)

## 2017-06-07 LAB — COMPREHENSIVE METABOLIC PANEL
A/G RATIO: 1.7 (ref 1.2–2.2)
ALK PHOS: 80 IU/L (ref 39–117)
ALT: 13 IU/L (ref 0–32)
AST: 19 IU/L (ref 0–40)
Albumin: 4.3 g/dL (ref 3.5–5.5)
BILIRUBIN TOTAL: 0.4 mg/dL (ref 0.0–1.2)
BUN/Creatinine Ratio: 16 (ref 9–23)
BUN: 11 mg/dL (ref 6–24)
CHLORIDE: 99 mmol/L (ref 96–106)
CO2: 25 mmol/L (ref 20–29)
Calcium: 9.9 mg/dL (ref 8.7–10.2)
Creatinine, Ser: 0.7 mg/dL (ref 0.57–1.00)
GFR calc Af Amer: 110 mL/min/{1.73_m2} (ref >59)
GFR calc non Af Amer: 96 mL/min/{1.73_m2} (ref >59)
GLUCOSE: 87 mg/dL (ref 65–99)
Globulin, Total: 2.5 g/dL (ref 1.5–4.5)
POTASSIUM: 4.4 mmol/L (ref 3.5–5.2)
Sodium: 140 mmol/L (ref 134–144)
Total Protein: 6.8 g/dL (ref 6.0–8.5)

## 2017-06-07 LAB — TSH: TSH: 6.34 u[IU]/mL — AB (ref 0.450–4.500)

## 2017-06-07 MED ORDER — LEVOTHYROXINE SODIUM 112 MCG PO TABS: 112.0000 ug | ORAL_TABLET | Freq: Every day | ORAL | 0 refills | Status: DC

## 2017-06-07 NOTE — Progress Notes (Signed)
Increased TSH

## 2017-06-09 ENCOUNTER — Telehealth: Payer: Self-pay | Admitting: Unknown Physician Specialty

## 2017-06-09 NOTE — Telephone Encounter (Signed)
Patient would like to speak with someone regarding thyroid results  Please Advise.  Thank you

## 2017-06-09 NOTE — Telephone Encounter (Signed)
Left message on machine for pt to return call to the office.  

## 2017-06-10 NOTE — Telephone Encounter (Signed)
Called and left patient a VM asking for her to please return my call.  

## 2017-06-10 NOTE — Telephone Encounter (Signed)
Called and spoke to patient. She stated that she could not log in to her my chart to see Cheryl's message. I read the patient the message that Malachy Mood left her. Patient did not have any further questions.

## 2017-09-02 DIAGNOSIS — Z8541 Personal history of malignant neoplasm of cervix uteri: Secondary | ICD-10-CM | POA: Diagnosis not present

## 2017-09-02 DIAGNOSIS — Z6829 Body mass index (BMI) 29.0-29.9, adult: Secondary | ICD-10-CM | POA: Diagnosis not present

## 2017-09-02 DIAGNOSIS — Z882 Allergy status to sulfonamides status: Secondary | ICD-10-CM | POA: Diagnosis not present

## 2017-09-02 DIAGNOSIS — Z08 Encounter for follow-up examination after completed treatment for malignant neoplasm: Secondary | ICD-10-CM | POA: Diagnosis not present

## 2017-09-02 DIAGNOSIS — I890 Lymphedema, not elsewhere classified: Secondary | ICD-10-CM | POA: Diagnosis not present

## 2017-09-02 DIAGNOSIS — Z90710 Acquired absence of both cervix and uterus: Secondary | ICD-10-CM | POA: Diagnosis not present

## 2017-09-27 ENCOUNTER — Encounter: Payer: Self-pay | Admitting: Unknown Physician Specialty

## 2017-09-27 ENCOUNTER — Ambulatory Visit: Payer: BLUE CROSS/BLUE SHIELD | Admitting: Unknown Physician Specialty

## 2017-09-27 VITALS — BP 115/81 | HR 64 | Temp 98.7°F | Wt 173.8 lb

## 2017-09-27 DIAGNOSIS — R3915 Urgency of urination: Secondary | ICD-10-CM | POA: Diagnosis not present

## 2017-09-27 DIAGNOSIS — N3000 Acute cystitis without hematuria: Secondary | ICD-10-CM

## 2017-09-27 DIAGNOSIS — E039 Hypothyroidism, unspecified: Secondary | ICD-10-CM

## 2017-09-27 DIAGNOSIS — R300 Dysuria: Secondary | ICD-10-CM | POA: Diagnosis not present

## 2017-09-27 DIAGNOSIS — M797 Fibromyalgia: Secondary | ICD-10-CM

## 2017-09-27 MED ORDER — TRAMADOL HCL 50 MG PO TABS: 50.0000 mg | ORAL_TABLET | Freq: Four times a day (QID) | ORAL | 2 refills | Status: DC | PRN

## 2017-09-27 MED ORDER — CEPHALEXIN 250 MG PO CAPS: 250.0000 mg | ORAL_CAPSULE | Freq: Four times a day (QID) | ORAL | 0 refills | Status: DC

## 2017-09-27 MED ORDER — CYCLOBENZAPRINE HCL 10 MG PO TABS: 10.0000 mg | ORAL_TABLET | Freq: Every day | ORAL | 3 refills | Status: DC

## 2017-09-27 NOTE — Assessment & Plan Note (Addendum)
Having a flare.  Last refill of Tramdol was 1 year ago.  Uses Approx #30 with 2 refills.  Encouraged to take Flexeril more regularly at night

## 2017-09-27 NOTE — Progress Notes (Signed)
BP 115/81   Pulse 64   Temp 98.7 F (37.1 C) (Oral)   Wt 173 lb 12.8 oz (78.8 kg)   LMP  (LMP Unknown)   SpO2 98%   BMI 32.52 kg/m    Subjective:    Patient ID: Robin Soto, female    DOB: Oct 30, 1956, 61 y.o.   MRN: [id number]  HPI: Robin Soto is a 61 y.o. female  Chief Complaint  Patient presents with  . Urinary Tract Infection    pt states she has had urinary urgency, pain, pressure, and burning that started Sunday   Urinary Tract Infection   This is a new problem. Episode onset: 2 days. The problem occurs every urination. The quality of the pain is described as aching. There has been no fever. She is not sexually active. There is no history of pyelonephritis. Pertinent negatives include no chills, discharge, nausea, sweats or vomiting. Her past medical history is significant for recurrent UTIs.   Hypothyroid Pt states she needs refill of thyroid meds.  Last TSH 6.3.     Fibromyalgia States this is acting up again. Has bilateral shoulders and elbows bothering.  She would like a refill of Flexeril and Tramadol.  Tramadol is taken prn and uses 15-20/month.  Takes Flexeril 10 mg QHS   Relevant past medical, surgical, family and social history reviewed and updated as indicated. Interim medical history since our last visit reviewed. Allergies and medications reviewed and updated.  Review of Systems  Constitutional: Negative for chills.  Gastrointestinal: Negative for nausea and vomiting.    Per HPI unless specifically indicated above     Objective:    BP 115/81   Pulse 64   Temp 98.7 F (37.1 C) (Oral)   Wt 173 lb 12.8 oz (78.8 kg)   LMP  (LMP Unknown)   SpO2 98%   BMI 32.52 kg/m   Wt Readings from Last 3 Encounters:  09/27/17 173 lb 12.8 oz (78.8 kg)  06/06/17 168 lb 11.2 oz (76.5 kg)  11/26/16 161 lb (73 kg)    Physical Exam  Constitutional: She is oriented to person, place, and time. She appears well-developed and well-nourished. No distress.    HENT:  Head: Normocephalic and atraumatic.  Eyes: Conjunctivae and lids are normal. Right eye exhibits no discharge. Left eye exhibits no discharge. No scleral icterus.  Cardiovascular: Normal rate and regular rhythm.  Pulmonary/Chest: Effort normal. No respiratory distress.  Abdominal: Soft. Normal appearance and bowel sounds are normal. She exhibits no distension. There is no splenomegaly or hepatomegaly. There is no tenderness. There is no CVA tenderness.  Musculoskeletal: Normal range of motion.  Neurological: She is alert and oriented to person, place, and time.  Skin: Skin is intact. No rash noted. No pallor.  Psychiatric: She has a normal mood and affect. Her behavior is normal. Judgment and thought content normal.  Nursing note and vitals reviewed.   Results for orders placed or performed in visit on 06/06/17  Comprehensive metabolic panel  Result Value Ref Range   Glucose 87 65 - 99 mg/dL   BUN 11 6 - 24 mg/dL   Creatinine, Ser 0.70 0.57 - 1.00 mg/dL   GFR calc non Af Amer 96 >59 mL/min/1.73   GFR calc Af Amer 110 >59 mL/min/1.73   BUN/Creatinine Ratio 16 9 - 23   Sodium 140 134 - 144 mmol/L   Potassium 4.4 3.5 - 5.2 mmol/L   Chloride 99 96 - 106 mmol/L   CO2 25  20 - 29 mmol/L   Calcium 9.9 8.7 - 10.2 mg/dL   Total Protein 6.8 6.0 - 8.5 g/dL   Albumin 4.3 3.5 - 5.5 g/dL   Globulin, Total 2.5 1.5 - 4.5 g/dL   Albumin/Globulin Ratio 1.7 1.2 - 2.2   Bilirubin Total 0.4 0.0 - 1.2 mg/dL   Alkaline Phosphatase 80 39 - 117 IU/L   AST 19 0 - 40 IU/L   ALT 13 0 - 32 IU/L  TSH  Result Value Ref Range   TSH 6.340 (H) 0.450 - 4.500 uIU/mL  Lipid Panel w/o Chol/HDL Ratio  Result Value Ref Range   Cholesterol, Total 200 (H) 100 - 199 mg/dL   Triglycerides 102 0 - 149 mg/dL   HDL 50 >39 mg/dL   VLDL Cholesterol Cal 20 5 - 40 mg/dL   LDL Calculated 130 (H) 0 - 99 mg/dL      Assessment & Plan:   Problem List Items Addressed This Visit      Unprioritized   Fibromyalgia     Having a flare.  Last refill of Tramdol was 1 year ago.  Uses Approx #30 with 2 refills.  Encouraged to take Flexeril more regularly at night      Hypothyroidism    Check TSH.  Change meds iff appropriate      Relevant Orders   TSH    Other Visit Diagnoses    Burning with urination    -  Primary   Relevant Orders   UA/M w/rflx Culture, Routine   Urinary urgency       Relevant Orders   UA/M w/rflx Culture, Routine   Acute cystitis without hematuria       Positive leukocytes.  Intolerant to Cipro, sulfa, and Macrobid.  Rx for Keflex       Follow up plan: Return in about 3 months (around 12/28/2017).

## 2017-09-27 NOTE — Assessment & Plan Note (Signed)
Check TSH.  Change meds iff appropriate

## 2017-09-28 ENCOUNTER — Other Ambulatory Visit: Payer: Self-pay | Admitting: Unknown Physician Specialty

## 2017-09-28 LAB — TSH: TSH: 5.9 u[IU]/mL — ABNORMAL HIGH (ref 0.450–4.500)

## 2017-09-28 MED ORDER — LEVOTHYROXINE SODIUM 125 MCG PO TABS: 125.0000 ug | ORAL_TABLET | Freq: Every day | ORAL | 3 refills | Status: DC

## 2017-09-28 NOTE — Progress Notes (Signed)
Increase from 112 mcgs

## 2017-09-29 LAB — UA/M W/RFLX CULTURE, ROUTINE
BILIRUBIN UA: NEGATIVE
Glucose, UA: NEGATIVE
KETONES UA: NEGATIVE
Nitrite, UA: NEGATIVE
Protein, UA: NEGATIVE
RBC UA: NEGATIVE
Specific Gravity, UA: 1.006 (ref 1.005–1.030)
Urobilinogen, Ur: 0.2 mg/dL (ref 0.2–1.0)
pH, UA: 6.5 (ref 5.0–7.5)

## 2017-09-29 LAB — MICROSCOPIC EXAMINATION: Casts: NONE SEEN /lpf

## 2017-09-29 LAB — URINE CULTURE, REFLEX

## 2017-10-12 ENCOUNTER — Ambulatory Visit: Payer: Self-pay | Admitting: *Deleted

## 2017-10-12 ENCOUNTER — Telehealth: Payer: Self-pay | Admitting: Unknown Physician Specialty

## 2017-10-12 MED ORDER — CEFUROXIME AXETIL 250 MG PO TABS: 250.0000 mg | ORAL_TABLET | Freq: Two times a day (BID) | ORAL | 0 refills | Status: DC

## 2017-10-12 NOTE — Telephone Encounter (Signed)
Called in c/o pressure and burning with urination.   She completed her Keflex on Tuesday and the symptoms had resolved.  This morning when she got up she experiencing pressure and burning with urination. She is wanting to know if Dr. Julian Hy can call in another antibiotic or if she needs to been seen again. I routed a note to the nurse pool for Dr. Julian Hy for further instructions. I informed pt she should expect a call from the office sometime today. I instructed her to call us back if she became worse in the meantime. She verbalized understanding.  Reason for Disposition . [1] Taking antibiotic > 72 hours (3 days) for UTI AND [2] painful urination or frequency not improved  Answer Assessment - Initial Assessment Questions 1. ANTIBIOTIC: "What antibiotic are you taking?" "How many times per day?"     Keflex   I finished it last Tuesday.    I woke up this morning with pressure and burning with urination.   Don't feel good.   I've had these before 2. DURATION: "When was the antibiotic started?"     Finished it Tuesday. 3. MAIN SYMPTOM: "What is the main symptom you are concerned about?"     UTI has returned with pressure and burning 4. FEVER: "Do you have a fever?" If so, ask: "What is it, how was it measured, and when did it start?"     No fever 5. OTHER SYMPTOMS: "Do you have any other symptoms?" (e.g., flank pain, vaginal discharge, blood in urine)     No changes in urine.  Protocols used: URINARY TRACT INFECTION ON ANTIBIOTIC FOLLOW-UP CALL [facility]

## 2017-10-12 NOTE — Telephone Encounter (Signed)
Routing to provider  

## 2017-10-12 NOTE — Telephone Encounter (Signed)
Message relayed to patient. Verbalized understanding and denied questions.   

## 2017-10-12 NOTE — Telephone Encounter (Signed)
Patient is still having symptoms of her UTI and she finished her antibiotic on Tuesday. She would like to have another round of antibiotic called in if possible. She is still having burning sensations/with frequency of urinating with little results.  [facility]  Thanks

## 2017-10-12 NOTE — Telephone Encounter (Signed)
Routed to pcp

## 2017-10-12 NOTE — Telephone Encounter (Signed)
OK, but I would like to change antibiotic.  I'm aware of her multiple sensitivities

## 2017-12-05 ENCOUNTER — Encounter: Payer: BLUE CROSS/BLUE SHIELD | Admitting: Unknown Physician Specialty

## 2017-12-15 ENCOUNTER — Ambulatory Visit: Payer: BLUE CROSS/BLUE SHIELD | Admitting: Family Medicine

## 2017-12-15 ENCOUNTER — Encounter: Payer: Self-pay | Admitting: Family Medicine

## 2017-12-15 VITALS — BP 112/75 | HR 73 | Temp 98.3°F | Wt 170.7 lb

## 2017-12-15 DIAGNOSIS — J101 Influenza due to other identified influenza virus with other respiratory manifestations: Secondary | ICD-10-CM | POA: Diagnosis not present

## 2017-12-15 DIAGNOSIS — R6889 Other general symptoms and signs: Secondary | ICD-10-CM | POA: Diagnosis not present

## 2017-12-15 LAB — VERITOR FLU A/B WAIVED
INFLUENZA B: NEGATIVE
Influenza A: POSITIVE — AB

## 2017-12-15 MED ORDER — BENZONATATE 200 MG PO CAPS: 200.0000 mg | ORAL_CAPSULE | Freq: Three times a day (TID) | ORAL | 0 refills | Status: DC | PRN

## 2017-12-15 MED ORDER — BALOXAVIR MARBOXIL(40 MG DOSE) 2 X 20 MG PO TBPK
40.0000 mg | ORAL_TABLET | Freq: Once | ORAL | 0 refills | Status: AC
Start: 2017-12-15 — End: 2017-12-15

## 2017-12-15 MED ORDER — HYDROCOD POLST-CPM POLST ER 10-8 MG/5ML PO SUER: 5.0000 mL | Freq: Two times a day (BID) | ORAL | 0 refills | Status: DC | PRN

## 2017-12-15 NOTE — Progress Notes (Signed)
   BP 112/75 (BP Location: Left Arm, Patient Position: Sitting, Cuff Size: Normal)   Pulse 73   Temp 98.3 F (36.8 C) (Oral)   Wt 170 lb 11.2 oz (77.4 kg)   LMP  (LMP Unknown)   SpO2 97%   BMI 31.94 kg/m    Subjective:    Patient ID: Robin Soto, female    DOB: 1955/12/19, 62 y.o.   MRN: [id number]  HPI: Robin Soto is a 61 y.o. female  Chief Complaint  Patient presents with  . Cough    x's 2 days.  . Headache  . Chills  . Appetite Suppression  . Generalized Body Aches   2 day hx of generalized body aches, chills, anorexia, chest congestion, cough, HA. Denies CP, SOB, N/V, ear pain. Taking OTC cold and flu medicines with mild relief Lots of sick contacts. Not UTD on flu vaccine.   Relevant past medical, surgical, family and social history reviewed and updated as indicated. Interim medical history since our last visit reviewed. Allergies and medications reviewed and updated.  Review of Systems  Per HPI unless specifically indicated above     Objective:    BP 112/75 (BP Location: Left Arm, Patient Position: Sitting, Cuff Size: Normal)   Pulse 73   Temp 98.3 F (36.8 C) (Oral)   Wt 170 lb 11.2 oz (77.4 kg)   LMP  (LMP Unknown)   SpO2 97%   BMI 31.94 kg/m   Wt Readings from Last 3 Encounters:  12/15/17 170 lb 11.2 oz (77.4 kg)  09/27/17 173 lb 12.8 oz (78.8 kg)  06/06/17 168 lb 11.2 oz (76.5 kg)    Physical Exam  Constitutional: She is oriented to person, place, and time. She appears well-developed and well-nourished. No distress.  HENT:  Head: Atraumatic.  B/l middle ear effusion Oropharynx and nasal mucosa erythematous with rhinorrhea present  Eyes: Conjunctivae are normal. Pupils are equal, round, and reactive to light.  Neck: Normal range of motion. Neck supple.  Cardiovascular: Normal rate and normal heart sounds.  Pulmonary/Chest: Effort normal and breath sounds normal. No respiratory distress.  Musculoskeletal: Normal range of motion.    Neurological: She is alert and oriented to person, place, and time.  Skin: Skin is warm and dry.  Psychiatric: She has a normal mood and affect. Her behavior is normal.  Nursing note and vitals reviewed.  Results for orders placed or performed in visit on 12/15/17  Veritor Flu A/B Waived  Result Value Ref Range   Influenza A Positive (A) Negative   Influenza B Negative Negative      Assessment & Plan:   Problem List Items Addressed This Visit    None    Visit Diagnoses    Influenza A    -  Primary   Will tx with xofluza and tessalon perles. Continue OTC remedies and supportive care prn. F/u if worsening or no improvement   Relevant Orders   Veritor Flu A/B Waived (Completed)       Follow up plan: Return for as scheduled.

## 2017-12-18 NOTE — Patient Instructions (Signed)
Follow up as needed

## 2017-12-26 ENCOUNTER — Encounter: Payer: Self-pay | Admitting: Unknown Physician Specialty

## 2017-12-26 ENCOUNTER — Ambulatory Visit: Payer: BLUE CROSS/BLUE SHIELD | Admitting: Unknown Physician Specialty

## 2017-12-26 VITALS — BP 109/75 | HR 67 | Temp 97.9°F | Wt 169.6 lb

## 2017-12-26 DIAGNOSIS — E039 Hypothyroidism, unspecified: Secondary | ICD-10-CM | POA: Diagnosis not present

## 2017-12-26 DIAGNOSIS — R309 Painful micturition, unspecified: Secondary | ICD-10-CM | POA: Diagnosis not present

## 2017-12-26 DIAGNOSIS — N3000 Acute cystitis without hematuria: Secondary | ICD-10-CM

## 2017-12-26 MED ORDER — CEFUROXIME AXETIL 250 MG PO TABS: 250.0000 mg | ORAL_TABLET | Freq: Two times a day (BID) | ORAL | 0 refills | Status: DC

## 2017-12-26 NOTE — Assessment & Plan Note (Signed)
Recheck TSH to monitor med change and continue symptoms of fatigue and weight gain.

## 2017-12-26 NOTE — Progress Notes (Signed)
BP 109/75   Pulse 67   Temp 97.9 F (36.6 C) (Oral)   Wt 169 lb 9.6 oz (76.9 kg)   LMP  (LMP Unknown)   SpO2 97%   BMI 31.73 kg/m    Subjective:    Patient ID: Robin Soto, female    DOB: April 30, 1956, 62 y.o.   MRN: [id number]  HPI: Robin Soto is a 62 y.o. female  Chief Complaint  Patient presents with  . Urinary Tract Infection    pt states she has noticied a urine odor and painful urination since yesterday    Urinary Tract Infection   This is a new problem. The current episode started yesterday. The problem occurs every urination. The problem has been gradually worsening. The quality of the pain is described as burning. Associated symptoms include frequency and urgency. Pertinent negatives include no chills, discharge, flank pain, hematuria, hesitancy, nausea, possible pregnancy, sweats or vomiting. She has tried nothing for the symptoms. The treatment provided no relief.   Hypothyroid Increased 3 months ago.  States she gained weight and has little energy  Relevant past medical, surgical, family and social history reviewed and updated as indicated. Interim medical history since our last visit reviewed. Allergies and medications reviewed and updated.  Review of Systems  Constitutional: Negative for chills.  Gastrointestinal: Negative for nausea and vomiting.  Genitourinary: Positive for frequency and urgency. Negative for flank pain, hematuria and hesitancy.    Per HPI unless specifically indicated above     Objective:    BP 109/75   Pulse 67   Temp 97.9 F (36.6 C) (Oral)   Wt 169 lb 9.6 oz (76.9 kg)   LMP  (LMP Unknown)   SpO2 97%   BMI 31.73 kg/m   Wt Readings from Last 3 Encounters:  12/26/17 169 lb 9.6 oz (76.9 kg)  12/15/17 170 lb 11.2 oz (77.4 kg)  09/27/17 173 lb 12.8 oz (78.8 kg)    Physical Exam  Constitutional: She is oriented to person, place, and time. She appears well-developed and well-nourished. No distress.  HENT:  Head:  Normocephalic and atraumatic.  Eyes: Conjunctivae and lids are normal. Right eye exhibits no discharge. Left eye exhibits no discharge. No scleral icterus.  Neck: Normal range of motion. Neck supple. No JVD present. Carotid bruit is not present.  Cardiovascular: Normal rate, regular rhythm and normal heart sounds.  Pulmonary/Chest: Effort normal and breath sounds normal.  Abdominal: Normal appearance. There is no splenomegaly or hepatomegaly.  Musculoskeletal: Normal range of motion.  Neurological: She is alert and oriented to person, place, and time.  Skin: Skin is warm, dry and intact. No rash noted. No pallor.  Psychiatric: She has a normal mood and affect. Her behavior is normal. Judgment and thought content normal.    Results for orders placed or performed in visit on 12/15/17  Veritor Flu A/B Waived  Result Value Ref Range   Influenza A Positive (A) Negative   Influenza B Negative Negative      Assessment & Plan:   Problem List Items Addressed This Visit      Unprioritized   Hypothyroidism    Recheck TSH to monitor med change and continue symptoms of fatigue and weight gain.        Relevant Orders   Thyroid Panel With TSH    Other Visit Diagnoses    Painful urination    -  Primary   Relevant Orders   UA/M w/rflx Culture, Routine   Acute  cystitis without hematuria       Positive urine.  New problem.  Multiple sensitivities.  Rx for Ceftin 250 mg BID which worked last time after chart review.  Increase fluids       Follow up plan: Return if symptoms worsen or fail to improve.

## 2017-12-27 LAB — THYROID PANEL WITH TSH
FREE THYROXINE INDEX: 2.6 (ref 1.2–4.9)
T3 Uptake Ratio: 25 % (ref 24–39)
T4, Total: 10.2 ug/dL (ref 4.5–12.0)
TSH: 4.22 u[IU]/mL (ref 0.450–4.500)

## 2017-12-27 NOTE — Progress Notes (Signed)
Normal labs.  Pt notified through mychart

## 2017-12-28 ENCOUNTER — Ambulatory Visit: Payer: BLUE CROSS/BLUE SHIELD | Admitting: Unknown Physician Specialty

## 2017-12-29 LAB — UA/M W/RFLX CULTURE, ROUTINE
Bilirubin, UA: NEGATIVE
Glucose, UA: NEGATIVE
Ketones, UA: NEGATIVE
Nitrite, UA: POSITIVE — AB
Protein, UA: NEGATIVE
RBC UA: NEGATIVE
Specific Gravity, UA: 1.015 (ref 1.005–1.030)
Urobilinogen, Ur: 0.2 mg/dL (ref 0.2–1.0)
pH, UA: 7 (ref 5.0–7.5)

## 2017-12-29 LAB — URINE CULTURE, REFLEX

## 2017-12-29 LAB — MICROSCOPIC EXAMINATION

## 2017-12-29 NOTE — Progress Notes (Signed)
Pt notified through mychart.

## 2018-01-06 ENCOUNTER — Other Ambulatory Visit: Payer: BLUE CROSS/BLUE SHIELD

## 2018-01-06 ENCOUNTER — Ambulatory Visit: Payer: Self-pay | Admitting: *Deleted

## 2018-01-06 DIAGNOSIS — N3000 Acute cystitis without hematuria: Secondary | ICD-10-CM | POA: Diagnosis not present

## 2018-01-06 NOTE — Telephone Encounter (Signed)
Routing to provider  

## 2018-01-06 NOTE — Telephone Encounter (Signed)
Please let her know that she does look like she still has a UTI, however, since she's allergic to the medicines that usually treat UTIs, I'd like her to drink a lot of fluids over the weekend and we're going to wait on the culture, so I know what medicine is actually going to work.

## 2018-01-06 NOTE — Telephone Encounter (Signed)
Patient notified

## 2018-01-06 NOTE — Telephone Encounter (Signed)
Pt called wanting another antibiotic called in for uti. She stated she had finished up the Ceftin and still no relief. She is having burning with urination, same symptoms as before.  Her last office visit was  12/26/17 regarding this problem. Her provider is Kathrine Haddock, NP

## 2018-01-06 NOTE — Addendum Note (Signed)
Addended by: Valerie Roys on: 01/06/2018 01:44 PM   Modules accepted: Orders

## 2018-01-06 NOTE — Telephone Encounter (Signed)
Will come in for UA- order in.

## 2018-01-08 LAB — UA/M W/RFLX CULTURE, ROUTINE
Bilirubin, UA: NEGATIVE
Glucose, UA: NEGATIVE
Ketones, UA: NEGATIVE
Nitrite, UA: NEGATIVE
PH UA: 6 (ref 5.0–7.5)
PROTEIN UA: NEGATIVE
Specific Gravity, UA: 1.015 (ref 1.005–1.030)
UUROB: 0.2 mg/dL (ref 0.2–1.0)

## 2018-01-08 LAB — MICROSCOPIC EXAMINATION: RBC, UA: NONE SEEN /hpf (ref 0–?)

## 2018-01-08 LAB — URINE CULTURE, REFLEX: Organism ID, Bacteria: NO GROWTH

## 2018-01-09 NOTE — Telephone Encounter (Signed)
Please let her know that her urine did not grow out any bacteria. Can we check to see if she's feeling any better?

## 2018-01-09 NOTE — Telephone Encounter (Signed)
Perfect! No need for medicine.

## 2018-01-09 NOTE — Telephone Encounter (Signed)
Called and let patient know what Dr. Wynetta Emery said. Patient states that she is feeling better. States she started feeling better on Saturday.

## 2018-02-06 ENCOUNTER — Encounter: Payer: Self-pay | Admitting: Unknown Physician Specialty

## 2018-02-06 ENCOUNTER — Ambulatory Visit: Payer: BLUE CROSS/BLUE SHIELD | Admitting: Unknown Physician Specialty

## 2018-02-06 VITALS — BP 117/70 | HR 62 | Temp 98.7°F | Ht 61.3 in | Wt 176.6 lb

## 2018-02-06 DIAGNOSIS — N3000 Acute cystitis without hematuria: Secondary | ICD-10-CM | POA: Diagnosis not present

## 2018-02-06 DIAGNOSIS — J301 Allergic rhinitis due to pollen: Secondary | ICD-10-CM

## 2018-02-06 DIAGNOSIS — R300 Dysuria: Secondary | ICD-10-CM | POA: Diagnosis not present

## 2018-02-06 LAB — UA/M W/RFLX CULTURE, ROUTINE
BILIRUBIN UA: NEGATIVE
Glucose, UA: NEGATIVE
Ketones, UA: NEGATIVE
NITRITE UA: NEGATIVE
PH UA: 7 (ref 5.0–7.5)
PROTEIN UA: NEGATIVE
Specific Gravity, UA: 1.01 (ref 1.005–1.030)
UUROB: 0.2 mg/dL (ref 0.2–1.0)

## 2018-02-06 LAB — MICROSCOPIC EXAMINATION

## 2018-02-06 MED ORDER — FLUTICASONE PROPIONATE 50 MCG/ACT NA SUSP: 2.0000 | Freq: Every day | NASAL | 6 refills | Status: DC

## 2018-02-06 MED ORDER — CEPHALEXIN 250 MG PO CAPS
250.0000 mg | ORAL_CAPSULE | Freq: Four times a day (QID) | ORAL | 0 refills | Status: DC
Start: 2018-02-06 — End: 2018-06-12

## 2018-02-06 NOTE — Progress Notes (Signed)
BP 117/70   Pulse 62   Temp 98.7 F (37.1 C) (Oral)   Ht 5' 1.3" (1.557 m)   Wt 176 lb 9.6 oz (80.1 kg)   LMP  (LMP Unknown)   SpO2 98%   BMI 33.04 kg/m    Subjective:    Patient ID: Robin Soto, female    DOB: 09/21/1956, 62 y.o.   MRN: [id number]  HPI: Robin Soto is a 62 y.o. female  Chief Complaint  Patient presents with  . Urinary Tract Infection    pt states she has been having a little burning, pressure, and odor for about 2 weeks    UTI Pt has a history of UTI's.  Came in last month to get a urine checked.  Urine positive but culture negative.  She thought she was better but burning, pressure, and odor for about 2 weeks.    Urinary Tract Infection   This is a new problem. The problem occurs every urination. The problem has been waxing and waning. There has been no fever. Associated symptoms include urgency. Pertinent negatives include no chills, discharge, flank pain, frequency, hematuria, hesitancy, nausea, possible pregnancy, sweats or vomiting. She has tried nothing for the symptoms. Her past medical history is significant for recurrent UTIs.   Wants something for allergies suffering for about a month now.  She is taking an OTC pill which helps and doesn't know what it is. Complains of burning eyes and nasal congestion.  OTC helps the burning eyes "some."   Relevant past medical, surgical, family and social history reviewed and updated as indicated. Interim medical history since our last visit reviewed. Allergies and medications reviewed and updated.  Review of Systems  Constitutional: Negative for chills.  Gastrointestinal: Negative for nausea and vomiting.  Genitourinary: Positive for urgency. Negative for flank pain, frequency, hematuria and hesitancy.    Per HPI unless specifically indicated above     Objective:    BP 117/70   Pulse 62   Temp 98.7 F (37.1 C) (Oral)   Ht 5' 1.3" (1.557 m)   Wt 176 lb 9.6 oz (80.1 kg)   LMP  (LMP Unknown)    SpO2 98%   BMI 33.04 kg/m   Wt Readings from Last 3 Encounters:  02/06/18 176 lb 9.6 oz (80.1 kg)  12/26/17 169 lb 9.6 oz (76.9 kg)  12/15/17 170 lb 11.2 oz (77.4 kg)    Physical Exam  Constitutional: She is oriented to person, place, and time. She appears well-developed and well-nourished. No distress.  HENT:  Head: Normocephalic and atraumatic.  Eyes: Conjunctivae and lids are normal. Right eye exhibits no discharge. Left eye exhibits no discharge. No scleral icterus.  Neck: Normal range of motion. Neck supple. No JVD present. Carotid bruit is not present.  Cardiovascular: Normal rate, regular rhythm and normal heart sounds.  Pulmonary/Chest: Effort normal and breath sounds normal.  Abdominal: Normal appearance. There is no splenomegaly or hepatomegaly.  Musculoskeletal: Normal range of motion.  Neurological: She is alert and oriented to person, place, and time.  Skin: Skin is warm, dry and intact. No rash noted. No pallor.  Psychiatric: She has a normal mood and affect. Her behavior is normal. Judgment and thought content normal.   Urine mildly positve with trace leukocytes.    Results for orders placed or performed in visit on 01/06/18  Microscopic Examination  Result Value Ref Range   WBC, UA 11-30 (A) 0 - 5 /hpf   RBC, UA None seen  0 - 2 /hpf   Epithelial Cells (non renal) 0-10 0 - 10 /hpf   Bacteria, UA Moderate (A) None seen/Few  Urine Culture, Reflex  Result Value Ref Range   Urine Culture, Routine Final report    Organism ID, Bacteria No growth   UA/M w/rflx Culture, Routine  Result Value Ref Range   Specific Gravity, UA 1.015 1.005 - 1.030   pH, UA 6.0 5.0 - 7.5   Color, UA Yellow Yellow   Appearance Ur Cloudy (A) Clear   Leukocytes, UA 2+ (A) Negative   Protein, UA Negative Negative/Trace   Glucose, UA Negative Negative   Ketones, UA Negative Negative   RBC, UA Trace (A) Negative   Bilirubin, UA Negative Negative   Urobilinogen, Ur 0.2 0.2 - 1.0 mg/dL    Nitrite, UA Negative Negative   Microscopic Examination See below:    Urinalysis Reflex Comment       Assessment & Plan:   Problem List Items Addressed This Visit      Unprioritized   Allergic rhinitis    Rx for Flonase.  Continue OTC medications       Other Visit Diagnoses    Burning with urination    -  Primary   Relevant Orders   UA/M w/rflx Culture, Routine   Acute cystitis without hematuria       Pt desires treatment rather than wait for culture results       Follow up plan: Return if symptoms worsen or fail to improve. and with culture results

## 2018-02-06 NOTE — Assessment & Plan Note (Signed)
Rx for Flonase.  Continue OTC medications

## 2018-02-27 ENCOUNTER — Other Ambulatory Visit: Payer: Self-pay | Admitting: Gynecologic Oncology

## 2018-02-27 DIAGNOSIS — Z1231 Encounter for screening mammogram for malignant neoplasm of breast: Secondary | ICD-10-CM

## 2018-03-03 ENCOUNTER — Ambulatory Visit
Admission: RE | Admit: 2018-03-03 | Discharge: 2018-03-03 | Disposition: A | Discharge status: Home / Self Care | Payer: BLUE CROSS/BLUE SHIELD | Source: Ambulatory Visit | Attending: Gynecologic Oncology | Admitting: Gynecologic Oncology

## 2018-03-03 DIAGNOSIS — Z1231 Encounter for screening mammogram for malignant neoplasm of breast: Secondary | ICD-10-CM | POA: Diagnosis not present

## 2018-05-16 ENCOUNTER — Other Ambulatory Visit: Payer: Self-pay

## 2018-05-16 ENCOUNTER — Encounter: Payer: Self-pay | Admitting: Unknown Physician Specialty

## 2018-05-16 ENCOUNTER — Ambulatory Visit: Payer: BLUE CROSS/BLUE SHIELD | Admitting: Unknown Physician Specialty

## 2018-05-16 VITALS — BP 120/79 | HR 62 | Temp 97.6°F | Ht 61.3 in | Wt 178.6 lb

## 2018-05-16 DIAGNOSIS — G4700 Insomnia, unspecified: Secondary | ICD-10-CM | POA: Insufficient documentation

## 2018-05-16 DIAGNOSIS — E079 Disorder of thyroid, unspecified: Secondary | ICD-10-CM | POA: Insufficient documentation

## 2018-05-16 DIAGNOSIS — Z23 Encounter for immunization: Secondary | ICD-10-CM

## 2018-05-16 DIAGNOSIS — R635 Abnormal weight gain: Secondary | ICD-10-CM | POA: Diagnosis not present

## 2018-05-16 DIAGNOSIS — I10 Essential (primary) hypertension: Secondary | ICD-10-CM | POA: Diagnosis not present

## 2018-05-16 DIAGNOSIS — M797 Fibromyalgia: Secondary | ICD-10-CM | POA: Diagnosis not present

## 2018-05-16 DIAGNOSIS — F5101 Primary insomnia: Secondary | ICD-10-CM

## 2018-05-16 DIAGNOSIS — E039 Hypothyroidism, unspecified: Secondary | ICD-10-CM | POA: Diagnosis not present

## 2018-05-16 MED ORDER — TRAZODONE HCL 50 MG PO TABS: 25.0000 mg | ORAL_TABLET | Freq: Every evening | ORAL | 3 refills | Status: DC | PRN

## 2018-05-16 NOTE — Assessment & Plan Note (Signed)
Check TSH today and adjust medications accordingly

## 2018-05-16 NOTE — Assessment & Plan Note (Signed)
Pt with a flare.  Will check sed rate and Vitamin B12.  Will rx Trazadone 50 mg QHS.  Caution taking with Cyclovenzeprine

## 2018-05-16 NOTE — Assessment & Plan Note (Signed)
Perhaps contributing to Fibro flare.  Will rx Trazadone 50 mg QHS.  Recheck in 6 weeks

## 2018-05-16 NOTE — Progress Notes (Signed)
BP 120/79   Pulse 62   Temp 97.6 F (36.4 C) (Oral)   Ht 5' 1.3" (1.557 m)   Wt 178 lb 9.6 oz (81 kg)   LMP  (LMP Unknown)   SpO2 99%   BMI 33.42 kg/m    Subjective:    Patient ID: Robin Soto, female    DOB: [birth year], 62 y.o.   MRN: [id number]  HPI: Robin Soto is a 62 y.o. female  Chief Complaint  Patient presents with  . Fatigue    pt states concerns of gaining weight   Fibromyalgia/hypothyroid Pt is here with an ongoing issue.   States she is tired and gaining weight. Feels her fibromyalgia is gaining weight with increased pain in neck and increased skin sensitivity.  Feels a gland in front of her neck gets swollen.  Talking vitamin C.  Wakes up hurting in knees and elbos.  Takes Cyclobnezeprine some nights.  Takes Tramadol on occasion.    Not sleeping well.  Tosses and turns much of the night.    Particularly frustrated with weight gain as she has dramatically improved eating habits.    Hypertension Using medications without difficulty Average home BPs Not chekcing   No problems or lightheadedness No chest pain with exertion or shortness of breath No Edema     Relevant past medical, surgical, family and social history reviewed and updated as indicated. Interim medical history since our last visit reviewed. Allergies and medications reviewed and updated.  Review of Systems  Per HPI unless specifically indicated above     Objective:    BP 120/79   Pulse 62   Temp 97.6 F (36.4 C) (Oral)   Ht 5' 1.3" (1.557 m)   Wt 178 lb 9.6 oz (81 kg)   LMP  (LMP Unknown)   SpO2 99%   BMI 33.42 kg/m   Wt Readings from Last 3 Encounters:  05/16/18 178 lb 9.6 oz (81 kg)  02/06/18 176 lb 9.6 oz (80.1 kg)  12/26/17 169 lb 9.6 oz (76.9 kg)    Physical Exam  Constitutional: She is oriented to person, place, and time. She appears well-developed and well-nourished. No distress.  HENT:  Head: Normocephalic and atraumatic.  Eyes: Conjunctivae and lids are  normal. Right eye exhibits no discharge. Left eye exhibits no discharge. No scleral icterus.  Neck: Normal range of motion. Neck supple. No JVD present. Carotid bruit is not present.  Cardiovascular: Normal rate, regular rhythm and normal heart sounds.  Pulmonary/Chest: Effort normal and breath sounds normal.  Abdominal: Normal appearance. There is no splenomegaly or hepatomegaly.  Musculoskeletal:  Leg swelling secondary to lymphadema  Neurological: She is alert and oriented to person, place, and time.  Skin: Skin is warm, dry and intact. No rash noted. No pallor.  Psychiatric: She has a normal mood and affect. Her behavior is normal. Judgment and thought content normal.    Results for orders placed or performed in visit on 02/06/18  Microscopic Examination  Result Value Ref Range   WBC, UA 0-5 0 - 5 /hpf   RBC, UA 0-2 0 - 2 /hpf   Epithelial Cells (non renal) 0-10 0 - 10 /hpf   Renal Epithel, UA 0-10 (A) None seen /hpf   Bacteria, UA Few None seen/Few  UA/M w/rflx Culture, Routine  Result Value Ref Range   Specific Gravity, UA 1.010 1.005 - 1.030   pH, UA 7.0 5.0 - 7.5   Color, UA Yellow Yellow   Appearance  Ur Clear Clear   Leukocytes, UA Trace (A) Negative   Protein, UA Negative Negative/Trace   Glucose, UA Negative Negative   Ketones, UA Negative Negative   RBC, UA Trace (A) Negative   Bilirubin, UA Negative Negative   Urobilinogen, Ur 0.2 0.2 - 1.0 mg/dL   Nitrite, UA Negative Negative   Microscopic Examination See below:       Assessment & Plan:   Problem List Items Addressed This Visit      Unprioritized   Fibromyalgia    Pt with a flare.  Will check sed rate and Vitamin B12.  Will rx Trazadone 50 mg QHS.  Caution taking with Cyclovenzeprine      Relevant Orders   Sed Rate (ESR)   C-reactive protein   Vitamin B12   VITAMIN D 25 Hydroxy (Vit-D Deficiency, Fractures)   Hypertension    Stable, continue present medications.        Relevant Orders    Comprehensive metabolic panel   Hypothyroidism    Check TSH today and adjust medications accordingly      Relevant Orders   TSH   Insomnia    Perhaps contributing to Fibro flare.  Will rx Trazadone 50 mg QHS.  Recheck in 6 weeks      Swelling of thyroid gland   Relevant Orders   US THYROID    Other Visit Diagnoses    Need for Tdap vaccination    -  Primary   Relevant Orders   Tdap vaccine greater than or equal to 7yo IM   Abnormal weight gain           Follow up plan: Return in about 6 weeks (around 06/27/2018).

## 2018-05-16 NOTE — Assessment & Plan Note (Signed)
Stable, continue present medications.   

## 2018-05-17 LAB — COMPREHENSIVE METABOLIC PANEL
ALBUMIN: 4.2 g/dL (ref 3.5–5.5)
ALK PHOS: 75 IU/L (ref 39–117)
ALT: 17 IU/L (ref 0–32)
AST: 16 IU/L (ref 0–40)
Albumin/Globulin Ratio: 1.8 (ref 1.2–2.2)
BUN / CREAT RATIO: 23 (ref 9–23)
BUN: 16 mg/dL (ref 6–24)
Bilirubin Total: <0.2 mg/dL (ref 0.0–1.2)
CO2: 26 mmol/L (ref 20–29)
CREATININE: 0.69 mg/dL (ref 0.57–1.00)
Calcium: 9.6 mg/dL (ref 8.7–10.2)
Chloride: 96 mmol/L (ref 96–106)
GFR calc Af Amer: 110 mL/min/{1.73_m2} (ref >59)
GFR calc non Af Amer: 96 mL/min/{1.73_m2} (ref >59)
GLOBULIN, TOTAL: 2.4 g/dL (ref 1.5–4.5)
Glucose: 80 mg/dL (ref 65–99)
POTASSIUM: 3.8 mmol/L (ref 3.5–5.2)
SODIUM: 140 mmol/L (ref 134–144)
Total Protein: 6.6 g/dL (ref 6.0–8.5)

## 2018-05-17 LAB — VITAMIN D 25 HYDROXY (VIT D DEFICIENCY, FRACTURES): VIT D 25 HYDROXY: 35.9 ng/mL (ref 30.0–100.0)

## 2018-05-17 LAB — VITAMIN B12: VITAMIN B 12: 294 pg/mL (ref 232–1245)

## 2018-05-17 LAB — SEDIMENTATION RATE: SED RATE: 4 mm/h (ref 0–40)

## 2018-05-17 LAB — TSH: TSH: 0.245 u[IU]/mL — ABNORMAL LOW (ref 0.450–4.500)

## 2018-05-17 LAB — C-REACTIVE PROTEIN: CRP: 2 mg/L (ref 0–10)

## 2018-05-25 ENCOUNTER — Ambulatory Visit: Payer: BLUE CROSS/BLUE SHIELD

## 2018-05-30 ENCOUNTER — Ambulatory Visit
Admission: RE | Admit: 2018-05-30 | Discharge: 2018-05-30 | Disposition: A | Discharge status: Home / Self Care | Payer: BLUE CROSS/BLUE SHIELD | Source: Ambulatory Visit | Attending: Unknown Physician Specialty | Admitting: Unknown Physician Specialty

## 2018-05-30 ENCOUNTER — Other Ambulatory Visit: Payer: Self-pay | Admitting: Unknown Physician Specialty

## 2018-05-30 DIAGNOSIS — E041 Nontoxic single thyroid nodule: Secondary | ICD-10-CM | POA: Insufficient documentation

## 2018-05-30 DIAGNOSIS — E079 Disorder of thyroid, unspecified: Secondary | ICD-10-CM | POA: Diagnosis present

## 2018-05-30 DIAGNOSIS — E042 Nontoxic multinodular goiter: Secondary | ICD-10-CM | POA: Diagnosis not present

## 2018-06-12 ENCOUNTER — Encounter: Payer: Self-pay | Admitting: Physician Assistant

## 2018-06-12 ENCOUNTER — Ambulatory Visit: Payer: BLUE CROSS/BLUE SHIELD | Admitting: Physician Assistant

## 2018-06-12 VITALS — BP 119/80 | HR 61 | Temp 98.2°F | Ht 61.0 in | Wt 175.4 lb

## 2018-06-12 DIAGNOSIS — E041 Nontoxic single thyroid nodule: Secondary | ICD-10-CM | POA: Insufficient documentation

## 2018-06-12 DIAGNOSIS — E039 Hypothyroidism, unspecified: Secondary | ICD-10-CM | POA: Diagnosis not present

## 2018-06-12 NOTE — Progress Notes (Signed)
   Subjective:    Patient ID: Robin Soto, female    DOB: [birth year], 62 y.o.   MRN: [id number]  Robin Soto is a 62 y.o. female presenting on 06/12/2018 for Insomnia (6 week f/up) and Hypothyroidism (pt states that she was supposed to have her thyroid levels rechecked today )   HPI   Patient presenting today for follow up of hypothyroidism. Currently on 125 mcg synthroid. TSH slightly low last check and so medication was not changed. Here today for recheck.   She had a thyroid ultrasound on 05/30/2018 that showed a couple of nodules that need follow up ultrasound in one year. She has been referred to ENT and has an appointment with Dr. Pryor Ochoa. She wants to know if this is absolutely necessary.                 Social History   Tobacco Use  . Smoking status: Never Smoker  . Smokeless tobacco: Never Used  Substance Use Topics  . Alcohol use: Yes    Alcohol/week: 0.0 standard drinks    Comment: glass of wine occasionally  . Drug use: No    Review of Systems Per HPI unless specifically indicated above     Objective:    BP 119/80   Pulse 61   Temp 98.2 F (36.8 C) (Oral)   Ht 5\' 1"  (1.549 m)   Wt 175 lb 6.4 oz (79.6 kg)   LMP  (LMP Unknown)   SpO2 98%   BMI 33.14 kg/m   Wt Readings from Last 3 Encounters:  06/12/18 175 lb 6.4 oz (79.6 kg)  05/16/18 178 lb 9.6 oz (81 kg)  02/06/18 176 lb 9.6 oz (80.1 kg)    Physical Exam  Constitutional: She is oriented to person, place, and time. She appears well-developed and well-nourished.  Neck: No thyromegaly present.  Cardiovascular: Normal rate and regular rhythm.  Pulmonary/Chest: Effort normal and breath sounds normal.  Neurological: She is alert and oriented to person, place, and time.  Skin: Skin is warm and dry.  Psychiatric: She has a normal mood and affect. Her behavior is normal.   Results for orders placed or performed in visit on 06/12/18  TSH  Result Value Ref Range   TSH 0.164 (L) 0.450 - 4.500 uIU/mL      Assessment & Plan:  1. Thyroid nodule  There were a couple of nodules on thyroid ultrasound that require follow up ultrasound in one year but did not meet criteria for biopsy. She has been referred to ENT. She may choose to keep this appointment if she wishes or primary care can order follow up ultrasound in one year.  Needs follow up thyroid ultrasound in one year.   2. Hypothyroidism, unspecified type  TSH is low. Will decrease synthroid from 125 mcg to 112 mcg synthroid.   - TSH    Follow up plan: Return in about 3 months (around 09/12/2018) for thyroid .  Carles Collet, PA-C [facility] 06/13/2018, 8:36 AM

## 2018-06-13 LAB — TSH: TSH: 0.164 u[IU]/mL — ABNORMAL LOW (ref 0.450–4.500)

## 2018-06-13 MED ORDER — LEVOTHYROXINE SODIUM 112 MCG PO TABS: 112.0000 ug | ORAL_TABLET | Freq: Every day | ORAL | 0 refills | Status: DC

## 2018-06-13 NOTE — Patient Instructions (Signed)
Hypothyroidism Hypothyroidism is a disorder of the thyroid. The thyroid is a large gland that is located in the lower front of the neck. The thyroid releases hormones that control how the body works. With hypothyroidism, the thyroid does not make enough of these hormones. What are the causes? Causes of hypothyroidism may include:  Viral infections.  Pregnancy.  Your own defense system (immune system) attacking your thyroid.  Certain medicines.  Birth defects.  Past radiation treatments to your head or neck.  Past treatment with radioactive iodine.  Past surgical removal of part or all of your thyroid.  Problems with the gland that is located in the center of your brain (pituitary).  What are the signs or symptoms? Signs and symptoms of hypothyroidism may include:  Feeling as though you have no energy (lethargy).  Inability to tolerate cold.  Weight gain that is not explained by a change in diet or exercise habits.  Dry skin.  Coarse hair.  Menstrual irregularity.  Slowing of thought processes.  Constipation.  Sadness or depression.  How is this diagnosed? Your health care provider may diagnose hypothyroidism with blood tests and ultrasound tests. How is this treated? Hypothyroidism is treated with medicine that replaces the hormones that your body does not make. After you begin treatment, it may take several weeks for symptoms to go away. Follow these instructions at home:  Take medicines only as directed by your health care provider.  If you start taking any new medicines, tell your health care provider.  Keep all follow-up visits as directed by your health care provider. This is important. As your condition improves, your dosage needs may change. You will need to have blood tests regularly so that your health care provider can watch your condition. Contact a health care provider if:  Your symptoms do not get better with treatment.  You are taking thyroid  replacement medicine and: ? You sweat excessively. ? You have tremors. ? You feel anxious. ? You lose weight rapidly. ? You cannot tolerate heat. ? You have emotional swings. ? You have diarrhea. ? You feel weak. Get help right away if:  You develop chest pain.  You develop an irregular heartbeat.  You develop a rapid heartbeat. This information is not intended to replace advice given to you by your health care provider. Make sure you discuss any questions you have with your health care provider. Document Released: 10/18/2005 Document Revised: 03/25/2016 Document Reviewed: 03/05/2014 Elsevier Interactive Patient Education  2018 [employer]  

## 2018-06-28 ENCOUNTER — Ambulatory Visit: Payer: BLUE CROSS/BLUE SHIELD | Admitting: Physician Assistant

## 2018-07-06 ENCOUNTER — Other Ambulatory Visit: Payer: Self-pay | Admitting: Otolaryngology

## 2018-07-06 DIAGNOSIS — E041 Nontoxic single thyroid nodule: Secondary | ICD-10-CM

## 2018-07-06 DIAGNOSIS — K219 Gastro-esophageal reflux disease without esophagitis: Secondary | ICD-10-CM | POA: Diagnosis not present

## 2018-08-22 DIAGNOSIS — E039 Hypothyroidism, unspecified: Secondary | ICD-10-CM | POA: Diagnosis not present

## 2018-08-25 DIAGNOSIS — K219 Gastro-esophageal reflux disease without esophagitis: Secondary | ICD-10-CM | POA: Diagnosis not present

## 2018-08-25 DIAGNOSIS — E041 Nontoxic single thyroid nodule: Secondary | ICD-10-CM | POA: Diagnosis not present

## 2018-09-06 ENCOUNTER — Other Ambulatory Visit: Payer: Self-pay | Admitting: Unknown Physician Specialty

## 2018-09-06 ENCOUNTER — Other Ambulatory Visit: Payer: Self-pay | Admitting: Nurse Practitioner

## 2018-09-06 ENCOUNTER — Other Ambulatory Visit: Payer: Self-pay | Admitting: Physician Assistant

## 2018-09-06 DIAGNOSIS — E039 Hypothyroidism, unspecified: Secondary | ICD-10-CM

## 2018-09-06 MED ORDER — TRAMADOL HCL 50 MG PO TABS: 50.0000 mg | ORAL_TABLET | Freq: Four times a day (QID) | ORAL | 0 refills | Status: DC | PRN

## 2018-09-06 NOTE — Telephone Encounter (Signed)
Patient states she only has enough medication to go through tomorrow, Thurs 09/07/18.

## 2018-09-06 NOTE — Telephone Encounter (Signed)
Requested medication (s) are due for refill today: yes  Requested medication (s) are on the active medication list: yes    Last refill: 09/27/17  #60  2 refills  Future visit scheduled yes 09/12/18 J. Ned Card, DNP  Notes to clinic: not delegated  Requested Prescriptions  Pending Prescriptions Disp Refills   traMADol (ULTRAM) 50 MG tablet [Pharmacy Med Name: TRAMADOL HCL 50 MG TAB] 60 tablet     Sig: TAKE 1 TABLET BY MOUTH EVERY 6 HOURS AS NEEDED     Not Delegated - Analgesics:  Opioid Agonists Failed - 09/06/2018  4:26 PM      Failed - This refill cannot be delegated      Failed - Urine Drug Screen completed in last 360 days.      Passed - Valid encounter within last 6 months    Recent Outpatient Visits          2 months ago Hypothyroidism, unspecified type   [facility] Trinna Post, PA-C   3 months ago Need for Tdap vaccination   [facility] Kathrine Haddock, NP   7 months ago Burning with urination   [place], NP   8 months ago Painful urination   [place], NP   8 months ago Influenza A   [place], Lilia Argue, [place]      Future Appointments            In 6 days Cannady, Barbaraann Faster, NP [employer]

## 2018-09-06 NOTE — Telephone Encounter (Signed)
I have provided enough tablets to make it to upcoming appointment on 09/12/18.

## 2018-09-06 NOTE — Progress Notes (Signed)
Patient requesting refill on Tramadol.  Only has enough to last her until tomorrow, 09/07/18.  She has upcoming appointment with provider on 09/12/18.  Will refill enough tablets to last her until scheduled appointment date, for further refills she will need to be seen in office.

## 2018-09-07 ENCOUNTER — Other Ambulatory Visit: Payer: Self-pay | Admitting: Nurse Practitioner

## 2018-09-07 DIAGNOSIS — E039 Hypothyroidism, unspecified: Secondary | ICD-10-CM

## 2018-09-07 MED ORDER — LEVOTHYROXINE SODIUM 112 MCG PO TABS: 112.0000 ug | ORAL_TABLET | Freq: Every day | ORAL | 2 refills | Status: DC

## 2018-09-07 NOTE — Telephone Encounter (Signed)
I provided her with enough tablets to make it to upcoming appointment.  For full refill she will have to attend upcomin appointment.

## 2018-09-07 NOTE — Progress Notes (Signed)
Patient requesting refill on Levothyroxine.  Medication refill sent to pharmacy.

## 2018-09-07 NOTE — Telephone Encounter (Signed)
Sent to pharmacy 

## 2018-09-12 ENCOUNTER — Ambulatory Visit: Payer: BLUE CROSS/BLUE SHIELD | Admitting: Nurse Practitioner

## 2018-10-13 NOTE — Telephone Encounter (Signed)
Patient has an appointment with Kathrine Haddock

## 2018-10-16 ENCOUNTER — Ambulatory Visit
Admission: RE | Admit: 2018-10-16 | Discharge: 2018-10-16 | Disposition: A | Discharge status: Home / Self Care | Payer: BLUE CROSS/BLUE SHIELD | Source: Ambulatory Visit | Attending: Unknown Physician Specialty | Admitting: Unknown Physician Specialty

## 2018-10-16 ENCOUNTER — Telehealth: Payer: Self-pay | Admitting: Nurse Practitioner

## 2018-10-16 ENCOUNTER — Ambulatory Visit (INDEPENDENT_AMBULATORY_CARE_PROVIDER_SITE_OTHER): Payer: BLUE CROSS/BLUE SHIELD | Admitting: Unknown Physician Specialty

## 2018-10-16 ENCOUNTER — Encounter: Payer: Self-pay | Admitting: Unknown Physician Specialty

## 2018-10-16 VITALS — BP 123/83 | Temp 97.8°F | Ht 61.0 in | Wt 178.0 lb

## 2018-10-16 DIAGNOSIS — M25561 Pain in right knee: Secondary | ICD-10-CM | POA: Insufficient documentation

## 2018-10-16 DIAGNOSIS — E039 Hypothyroidism, unspecified: Secondary | ICD-10-CM | POA: Diagnosis not present

## 2018-10-16 DIAGNOSIS — M1712 Unilateral primary osteoarthritis, left knee: Secondary | ICD-10-CM | POA: Diagnosis not present

## 2018-10-16 DIAGNOSIS — M25562 Pain in left knee: Secondary | ICD-10-CM | POA: Insufficient documentation

## 2018-10-16 DIAGNOSIS — E063 Autoimmune thyroiditis: Secondary | ICD-10-CM | POA: Diagnosis not present

## 2018-10-16 DIAGNOSIS — E663 Overweight: Secondary | ICD-10-CM | POA: Insufficient documentation

## 2018-10-16 DIAGNOSIS — M170 Bilateral primary osteoarthritis of knee: Secondary | ICD-10-CM

## 2018-10-16 DIAGNOSIS — E079 Disorder of thyroid, unspecified: Secondary | ICD-10-CM | POA: Diagnosis not present

## 2018-10-16 DIAGNOSIS — M797 Fibromyalgia: Secondary | ICD-10-CM

## 2018-10-16 DIAGNOSIS — K219 Gastro-esophageal reflux disease without esophagitis: Secondary | ICD-10-CM

## 2018-10-16 DIAGNOSIS — M1711 Unilateral primary osteoarthritis, right knee: Secondary | ICD-10-CM | POA: Diagnosis not present

## 2018-10-16 MED ORDER — OMEPRAZOLE 40 MG PO CPDR: 40.0000 mg | DELAYED_RELEASE_CAPSULE | Freq: Every day | ORAL | 1 refills | Status: DC

## 2018-10-16 MED ORDER — TRAMADOL HCL 50 MG PO TABS: 50.0000 mg | ORAL_TABLET | Freq: Four times a day (QID) | ORAL | 0 refills | Status: DC | PRN

## 2018-10-16 NOTE — Assessment & Plan Note (Signed)
New diagnosis.  Check thyroid panel

## 2018-10-16 NOTE — Addendum Note (Signed)
Addended by: Marnee Guarneri T on: 10/16/2018 01:19 PM   Modules accepted: Orders

## 2018-10-16 NOTE — Assessment & Plan Note (Signed)
Refill Omeprazole at a higher dose

## 2018-10-16 NOTE — Telephone Encounter (Signed)
Spoke with patient to review knee imaging results.  Right knee noting "Moderate-to-marked medial tibiofemoral joint space narrowing and degenerative changes. Possible subchondral cyst medial aspect of the medial tibial plateau."  And left knee noting marked joint space narrowing.  Discussed arthritis pain with patient and educated on use of Tylenol as needed for discomfort + exercise/weight loss.  Will place consult to orthopedics for further evaluation and recommendations.

## 2018-10-16 NOTE — Progress Notes (Signed)
BP 123/83 (BP Location: Right Arm, Patient Position: Sitting, Cuff Size: Normal)   Temp 97.8 F (36.6 C)   Ht '5\' 1"'$  (1.549 m)   Wt 178 lb (80.7 kg)   LMP  (LMP Unknown)   SpO2 100%   BMI 33.63 kg/m    Subjective:    Patient ID: Robin Soto, female    DOB: 09-09-1956, 62 y.o.   MRN: [id number]  HPI: Robin Soto is a 62 y.o. female  Chief Complaint  Patient presents with  . Gastroesophageal Reflux    refill on omeprazole, ENT increased omeprazole, told her that she could alternate between 20 and '40mg'$   . Fibromyalgia    refill on tramadol   GERD Pt taking 40 mg of Omeprazole originally recently increased to 40 mg by ENT.  Feels reflux is better.    Hashimotos Recent diagnosis through ENT.  Wants thyroid checked every 6 months.    Fibromyalgia Knees seem to be worse.  Taking Tramadol prn "when I hurt really bad."  Sometimes not at all and sometimes daily.  Last refill of #30 was November.  Lately taking it daily. Getting down and up is getting to be difficult.  Bilateral knee pain worsening.    Weight gain Pt frustrated with weight.   States she is working on her diet but gaining weight despite.     Relevant past medical, surgical, family and social history reviewed and updated as indicated. Interim medical history since our last visit reviewed. Allergies and medications reviewed and updated.  Review of Systems  Per HPI unless specifically indicated above     Objective:    BP 123/83 (BP Location: Right Arm, Patient Position: Sitting, Cuff Size: Normal)   Temp 97.8 F (36.6 C)   Ht '5\' 1"'$  (1.549 m)   Wt 178 lb (80.7 kg)   LMP  (LMP Unknown)   SpO2 100%   BMI 33.63 kg/m   Wt Readings from Last 3 Encounters:  10/16/18 178 lb (80.7 kg)  06/12/18 175 lb 6.4 oz (79.6 kg)  05/16/18 178 lb 9.6 oz (81 kg)    Physical Exam Constitutional:      General: She is not in acute distress.    Appearance: Normal appearance. She is well-developed.  HENT:     Head:  Normocephalic and atraumatic.  Eyes:     General: Lids are normal. No scleral icterus.       Right eye: No discharge.        Left eye: No discharge.     Conjunctiva/sclera: Conjunctivae normal.  Neck:     Musculoskeletal: Normal range of motion and neck supple.     Vascular: No carotid bruit or JVD.  Cardiovascular:     Rate and Rhythm: Normal rate and regular rhythm.     Heart sounds: Normal heart sounds.  Pulmonary:     Effort: Pulmonary effort is normal.     Breath sounds: Normal breath sounds.  Abdominal:     Palpations: There is no hepatomegaly or splenomegaly.  Musculoskeletal: Normal range of motion.  Skin:    General: Skin is warm and dry.     Coloration: Skin is not pale.     Findings: No rash.  Neurological:     Mental Status: She is alert and oriented to person, place, and time.  Psychiatric:        Behavior: Behavior normal.        Thought Content: Thought content normal.  Judgment: Judgment normal.     Results for orders placed or performed in visit on 06/12/18  TSH  Result Value Ref Range   TSH 0.164 (L) 0.450 - 4.500 uIU/mL      Assessment & Plan:   Problem List Items Addressed This Visit      Unprioritized   Fibromyalgia    Taking Tramadol daily.  Will refill today and recheck in one month.  Check Sed rate and CRP      GERD (gastroesophageal reflux disease)    Refill Omeprazole at a higher dose      Relevant Medications   omeprazole (PRILOSEC) 40 MG capsule   Hashimoto's disease - Primary    New diagnosis.  Check thyroid panel      Relevant Orders   Thyroid Panel With TSH   Sed Rate (ESR)   C-reactive protein   Hypothyroidism   Relevant Orders   Thyroid Panel With TSH    Other Visit Diagnoses    Pain in both knees, unspecified chronicity       Worsening.  Will get x-ray for further evaluation   Relevant Orders   DG Knee Complete 4 Views Left       Follow up plan: Return in about 4 weeks (around 11/13/2018).

## 2018-10-16 NOTE — Assessment & Plan Note (Addendum)
Discussed exercise.  Will try to ride 10 minutes twice a day on exercise bike

## 2018-10-16 NOTE — Assessment & Plan Note (Addendum)
Taking Tramadol daily.  Will refill today and recheck in one month.  Check Sed rate and CRP

## 2018-10-17 LAB — SEDIMENTATION RATE: SED RATE: 3 mm/h (ref 0–40)

## 2018-10-17 LAB — C-REACTIVE PROTEIN: CRP: 1 mg/L (ref 0–10)

## 2018-10-17 LAB — COMPREHENSIVE METABOLIC PANEL
ALT: 16 IU/L (ref 0–32)
AST: 14 IU/L (ref 0–40)
Albumin/Globulin Ratio: 1.7 (ref 1.2–2.2)
Albumin: 4.5 g/dL (ref 3.5–5.5)
Alkaline Phosphatase: 88 IU/L (ref 39–117)
BUN/Creatinine Ratio: 14 (ref 9–23)
BUN: 11 mg/dL (ref 6–24)
Bilirubin Total: 0.4 mg/dL (ref 0.0–1.2)
CO2: 26 mmol/L (ref 20–29)
CREATININE: 0.76 mg/dL (ref 0.57–1.00)
Calcium: 10.1 mg/dL (ref 8.7–10.2)
Chloride: 97 mmol/L (ref 96–106)
GFR calc Af Amer: 99 mL/min/{1.73_m2} (ref >59)
GFR calc non Af Amer: 86 mL/min/{1.73_m2} (ref >59)
GLUCOSE: 84 mg/dL (ref 65–99)
Globulin, Total: 2.6 g/dL (ref 1.5–4.5)
POTASSIUM: 3.7 mmol/L (ref 3.5–5.2)
Sodium: 138 mmol/L (ref 134–144)
Total Protein: 7.1 g/dL (ref 6.0–8.5)

## 2018-10-17 LAB — CBC WITH DIFFERENTIAL/PLATELET
BASOS: 0 %
Basophils Absolute: 0 10*3/uL (ref 0.0–0.2)
EOS (ABSOLUTE): 0.1 10*3/uL (ref 0.0–0.4)
EOS: 2 %
Hematocrit: 43.2 % (ref 34.0–46.6)
Hemoglobin: 14.6 g/dL (ref 11.1–15.9)
IMMATURE GRANS (ABS): 0 10*3/uL (ref 0.0–0.1)
IMMATURE GRANULOCYTES: 0 %
LYMPHS: 26 %
Lymphocytes Absolute: 2 10*3/uL (ref 0.7–3.1)
MCH: 29.8 pg (ref 26.6–33.0)
MCHC: 33.8 g/dL (ref 31.5–35.7)
MCV: 88 fL (ref 79–97)
MONOCYTES: 8 %
Monocytes Absolute: 0.6 10*3/uL (ref 0.1–0.9)
NEUTROS PCT: 64 %
Neutrophils Absolute: 4.8 10*3/uL (ref 1.4–7.0)
PLATELETS: 334 10*3/uL (ref 150–450)
RBC: 4.9 x10E6/uL (ref 3.77–5.28)
RDW: 12.7 % (ref 12.3–15.4)
WBC: 7.5 10*3/uL (ref 3.4–10.8)

## 2018-10-17 LAB — THYROID PANEL WITH TSH
Free Thyroxine Index: 3 (ref 1.2–4.9)
T3 Uptake Ratio: 27 % (ref 24–39)
T4, Total: 11 ug/dL (ref 4.5–12.0)
TSH: 3.38 u[IU]/mL (ref 0.450–4.500)

## 2018-10-20 NOTE — Progress Notes (Signed)
Notified pt by mychart

## 2018-11-03 ENCOUNTER — Encounter: Payer: Self-pay | Admitting: Unknown Physician Specialty

## 2018-11-03 ENCOUNTER — Ambulatory Visit: Payer: BLUE CROSS/BLUE SHIELD | Admitting: Unknown Physician Specialty

## 2018-11-03 ENCOUNTER — Other Ambulatory Visit: Payer: Self-pay

## 2018-11-03 DIAGNOSIS — M170 Bilateral primary osteoarthritis of knee: Secondary | ICD-10-CM | POA: Diagnosis not present

## 2018-11-03 DIAGNOSIS — E663 Overweight: Secondary | ICD-10-CM | POA: Diagnosis not present

## 2018-11-03 NOTE — Assessment & Plan Note (Signed)
Reviewed food diary.  Recommended to cut out sweet tea, decrease carbohydrates (pay attention to serving size), and eat more vegetables.

## 2018-11-03 NOTE — Assessment & Plan Note (Signed)
Severe on x-ray.  Sed rate and CRP is normal.  Injection of right knee given today (see note).  Refer to orthopedics for further management.

## 2018-11-03 NOTE — Progress Notes (Signed)
BP 125/82   Pulse 65   Temp 98.3 F (36.8 C) (Oral)   Ht _0  (1.549 m)   Wt 181 lb (82.1 kg)   LMP  (LMP Unknown)   SpO2 100%   BMI 34.20 kg/m    Subjective:    Patient ID: Robin Soto, female    DOB: 02-06-1956, 63 y.o.   MRN: [id number]  HPI: Robin Soto is a 63 y.o. female  Chief Complaint  Patient presents with  . Results    knees xrays   Knee pain See last visit.  Pt is having a great deal of problems with bilateral knee pain with right worse than left.  She is managing mostly with Tylenol and the occasional Tramadol.  She is worse after returning to work and walking on cement floors.    Weight gain Brought in a food diary.  She is still struggling with weight gain.    Relevant past medical, surgical, family and social history reviewed and updated as indicated. Interim medical history since our last visit reviewed. Allergies and medications reviewed and updated.  Review of Systems  Per HPI unless specifically indicated above     Objective:    BP 125/82   Pulse 65   Temp 98.3 F (36.8 C) (Oral)   Ht _1  (1.549 m)   Wt 181 lb (82.1 kg)   LMP  (LMP Unknown)   SpO2 100%   BMI 34.20 kg/m   Wt Readings from Last 3 Encounters:  11/03/18 181 lb (82.1 kg)  10/16/18 178 lb (80.7 kg)  06/12/18 175 lb 6.4 oz (79.6 kg)    Physical Exam Constitutional:      General: She is not in acute distress.    Appearance: Normal appearance. She is well-developed.  HENT:     Head: Normocephalic and atraumatic.  Eyes:     General: Lids are normal. No scleral icterus.       Right eye: No discharge.        Left eye: No discharge.     Conjunctiva/sclera: Conjunctivae normal.  Cardiovascular:     Rate and Rhythm: Normal rate.  Pulmonary:     Effort: Pulmonary effort is normal.  Abdominal:     Palpations: There is no hepatomegaly or splenomegaly.  Musculoskeletal: Normal range of motion.  Skin:    Coloration: Skin is not pale.     Findings: No rash.    Neurological:     Mental Status: She is alert and oriented to person, place, and time.  Psychiatric:        Behavior: Behavior normal.        Thought Content: Thought content normal.        Judgment: Judgment normal.     Results for orders placed or performed in visit on 10/16/18  Thyroid Panel With TSH  Result Value Ref Range   TSH 3.380 0.450 - 4.500 uIU/mL   T4, Total 11.0 4.5 - 12.0 ug/dL   T3 Uptake Ratio 27 24 - 39 %   Free Thyroxine Index 3.0 1.2 - 4.9  Sed Rate (ESR)  Result Value Ref Range   Sed Rate 3 0 - 40 mm/hr  C-reactive protein  Result Value Ref Range   CRP 1 0 - 10 mg/L  Comprehensive metabolic panel  Result Value Ref Range   Glucose 84 65 - 99 mg/dL   BUN 11 6 - 24 mg/dL   Creatinine, Ser 0.76 0.57 - 1.00 mg/dL  GFR calc non Af Amer 86 >59 mL/min/1.73   GFR calc Af Amer 99 >59 mL/min/1.73   BUN/Creatinine Ratio 14 9 - 23   Sodium 138 134 - 144 mmol/L   Potassium 3.7 3.5 - 5.2 mmol/L   Chloride 97 96 - 106 mmol/L   CO2 26 20 - 29 mmol/L   Calcium 10.1 8.7 - 10.2 mg/dL   Total Protein 7.1 6.0 - 8.5 g/dL   Albumin 4.5 3.5 - 5.5 g/dL   Globulin, Total 2.6 1.5 - 4.5 g/dL   Albumin/Globulin Ratio 1.7 1.2 - 2.2   Bilirubin Total 0.4 0.0 - 1.2 mg/dL   Alkaline Phosphatase 88 39 - 117 IU/L   AST 14 0 - 40 IU/L   ALT 16 0 - 32 IU/L  CBC with Differential/Platelet  Result Value Ref Range   WBC 7.5 3.4 - 10.8 x10E3/uL   RBC 4.90 3.77 - 5.28 x10E6/uL   Hemoglobin 14.6 11.1 - 15.9 g/dL   Hematocrit 43.2 34.0 - 46.6 %   MCV 88 79 - 97 fL   MCH 29.8 26.6 - 33.0 pg   MCHC 33.8 31.5 - 35.7 g/dL   RDW 12.7 12.3 - 15.4 %   Platelets 334 150 - 450 x10E3/uL   Neutrophils 64 Not Estab. %   Lymphs 26 Not Estab. %   Monocytes 8 Not Estab. %   Eos 2 Not Estab. %   Basos 0 Not Estab. %   Neutrophils Absolute 4.8 1.4 - 7.0 x10E3/uL   Lymphocytes Absolute 2.0 0.7 - 3.1 x10E3/uL   Monocytes Absolute 0.6 0.1 - 0.9 x10E3/uL   EOS (ABSOLUTE) 0.1 0.0 - 0.4 x10E3/uL    Basophils Absolute 0.0 0.0 - 0.2 x10E3/uL   Immature Granulocytes 0 Not Estab. %   Immature Grans (Abs) 0.0 0.0 - 0.1 x10E3/uL      Assessment & Plan:   Problem List Items Addressed This Visit      Unprioritized   Arthritis of both knees    Severe on x-ray.  Sed rate and CRP is normal.  Injection of right knee given today (see note).  Refer to orthopedics for further management.        Relevant Orders   Ambulatory referral to Orthopedic Surgery   Overweight    Reviewed food diary.  Recommended to cut out sweet tea, decrease carbohydrates (pay attention to serving size), and eat more vegetables.           STEROID INJECTION  Procedure: Knee Intraarticular Steroid Injection   Description: After verbal consent and patient ed on Area prepped and draped using  semi-sterile technique. Using a anterior  approach, a mixture of 4 cc of  1% Marcaine & 1 cc of Kenalog 40 was injected into knee joint.  A bandage was then placed over the injection site. Complications:  none Post Procedure Instructions: To the ER if any symptoms of erythema or swelling.      Follow up plan: Return if symptoms worsen or fail to improve.

## 2018-11-09 NOTE — Telephone Encounter (Signed)
Jolene can you close this one out for some reason it will not allow me to do so.  Thank You

## 2018-11-14 DIAGNOSIS — M1712 Unilateral primary osteoarthritis, left knee: Secondary | ICD-10-CM | POA: Diagnosis not present

## 2018-11-14 DIAGNOSIS — M25561 Pain in right knee: Secondary | ICD-10-CM | POA: Diagnosis not present

## 2018-11-14 DIAGNOSIS — M1711 Unilateral primary osteoarthritis, right knee: Secondary | ICD-10-CM | POA: Diagnosis not present

## 2018-11-14 DIAGNOSIS — M25562 Pain in left knee: Secondary | ICD-10-CM | POA: Diagnosis not present

## 2018-11-21 NOTE — Progress Notes (Signed)
Thank you Cheryl!

## 2018-11-27 ENCOUNTER — Encounter: Payer: Self-pay | Admitting: Nurse Practitioner

## 2018-11-27 ENCOUNTER — Ambulatory Visit: Payer: BLUE CROSS/BLUE SHIELD | Admitting: Nurse Practitioner

## 2018-11-27 ENCOUNTER — Other Ambulatory Visit: Payer: Self-pay

## 2018-11-27 VITALS — BP 121/85 | HR 67 | Temp 98.1°F | Ht 61.0 in | Wt 182.0 lb

## 2018-11-27 DIAGNOSIS — J0100 Acute maxillary sinusitis, unspecified: Secondary | ICD-10-CM

## 2018-11-27 DIAGNOSIS — H1031 Unspecified acute conjunctivitis, right eye: Secondary | ICD-10-CM

## 2018-11-27 DIAGNOSIS — J029 Acute pharyngitis, unspecified: Secondary | ICD-10-CM | POA: Diagnosis not present

## 2018-11-27 DIAGNOSIS — H109 Unspecified conjunctivitis: Secondary | ICD-10-CM | POA: Insufficient documentation

## 2018-11-27 MED ORDER — ERYTHROMYCIN 5 MG/GM OP OINT: 1.0000 "application " | TOPICAL_OINTMENT | Freq: Three times a day (TID) | OPHTHALMIC | 0 refills | Status: AC

## 2018-11-27 MED ORDER — AMOXICILLIN-POT CLAVULANATE 875-125 MG PO TABS: 1.0000 | ORAL_TABLET | Freq: Two times a day (BID) | ORAL | 0 refills | Status: AC

## 2018-11-27 NOTE — Progress Notes (Signed)
BP 121/85   Pulse 67   Temp 98.1 F (36.7 C) (Oral)   Ht '5\' 1"'$  (1.549 m)   Wt 182 lb (82.6 kg)   LMP  (LMP Unknown)   SpO2 95%   BMI 34.39 kg/m    Subjective:    Patient ID: Robin Soto, female    DOB: [birth year], 63 y.o.   MRN: [id number]  HPI: Robin Soto is a 63 y.o. female  Chief Complaint  Patient presents with  . Cough    x over a weeks  . Nasal Congestion  . Sore Throat  . Ear Pain    bilateral. pt states she got up with her right eye swollen   UPPER RESPIRATORY TRACT INFECTION Woke-up this morning and right eye was "matted up and swollen".  She presented over a week ago with cough, which is worse at night. Worst symptom: cough Fever: no Cough: yes Shortness of breath: no Wheezing: no Chest pain: no Chest tightness: yes Chest congestion: yes Nasal congestion: yes Runny nose: yes Post nasal drip: yes Sneezing: no Sore throat: yes Swollen glands: no Sinus pressure: yes Headache: yes Face pain: no Toothache: no Ear pain: yes bilateral Ear pressure: yes bilateral Eyes red/itching:yes Eye drainage/crusting: yes  Vomiting: no Rash: no Fatigue: yes Sick contacts: yes Strep contacts: no  Context: fluctuating Recurrent sinusitis: no Relief with OTC cold/cough medications: no  Treatments attempted: Flonase, cough drops and cold/sinus   Relevant past medical, surgical, family and social history reviewed and updated as indicated. Interim medical history since our last visit reviewed. Allergies and medications reviewed and updated.  Review of Systems  Constitutional: Positive for fatigue. Negative for activity change, appetite change and fever.  HENT: Positive for congestion, ear pain, postnasal drip, rhinorrhea, sinus pressure and sore throat. Negative for ear discharge, facial swelling, sinus pain, sneezing and voice change.   Eyes: Positive for discharge, redness and itching. Negative for pain and visual disturbance.  Respiratory: Positive  for cough and chest tightness. Negative for shortness of breath and wheezing.   Cardiovascular: Negative for chest pain, palpitations and leg swelling.  Gastrointestinal: Negative for abdominal distention, abdominal pain, constipation, diarrhea, nausea and vomiting.  Endocrine: Negative.   Musculoskeletal: Negative for myalgias.  Neurological: Negative for dizziness, numbness and headaches.  Psychiatric/Behavioral: Negative.     Per HPI unless specifically indicated above     Objective:    BP 121/85   Pulse 67   Temp 98.1 F (36.7 C) (Oral)   Ht '5\' 1"'$  (1.549 m)   Wt 182 lb (82.6 kg)   LMP  (LMP Unknown)   SpO2 95%   BMI 34.39 kg/m   Wt Readings from Last 3 Encounters:  11/27/18 182 lb (82.6 kg)  11/03/18 181 lb (82.1 kg)  10/16/18 178 lb (80.7 kg)    Physical Exam Vitals signs and nursing note reviewed.  Constitutional:      General: She is awake.     Appearance: She is well-developed. She is ill-appearing.  HENT:     Head: Normocephalic. No raccoon eyes.     Right Ear: Hearing, ear canal and external ear normal. No drainage. A middle ear effusion is present.     Left Ear: Hearing, ear canal and external ear normal. No drainage. A middle ear effusion is present.     Nose: Mucosal edema, congestion and rhinorrhea present. Rhinorrhea is purulent.     Right Sinus: Maxillary sinus tenderness present. No frontal sinus tenderness.  Left Sinus: Maxillary sinus tenderness present. No frontal sinus tenderness.     Mouth/Throat:     Mouth: Mucous membranes are moist.     Pharynx: Posterior oropharyngeal erythema (mild with cobblestoning) present. No pharyngeal swelling or oropharyngeal exudate.  Eyes:     General: Lids are normal. Lids are everted, no foreign bodies appreciated. Vision grossly intact.        Right eye: Discharge (whitish/clear) present. No foreign body.        Left eye: No foreign body or discharge.     Extraocular Movements: Extraocular movements intact.      Conjunctiva/sclera:     Right eye: Right conjunctiva is injected.     Left eye: Left conjunctiva is not injected.     Pupils: Pupils are equal, round, and reactive to light.  Neck:     Musculoskeletal: Normal range of motion and neck supple.     Thyroid: No thyromegaly.     Vascular: No carotid bruit or JVD.  Cardiovascular:     Rate and Rhythm: Normal rate and regular rhythm.     Heart sounds: Normal heart sounds. No murmur. No gallop.   Pulmonary:     Effort: Pulmonary effort is normal.     Breath sounds: Normal breath sounds.  Abdominal:     General: Bowel sounds are normal.     Palpations: Abdomen is soft. There is no hepatomegaly or splenomegaly.  Musculoskeletal:     Right lower leg: No edema.     Left lower leg: No edema.  Lymphadenopathy:     Cervical: No cervical adenopathy.  Skin:    General: Skin is warm and dry.  Neurological:     Mental Status: She is alert and oriented to person, place, and time.  Psychiatric:        Attention and Perception: Attention normal.        Mood and Affect: Mood normal.        Behavior: Behavior normal. Behavior is cooperative.        Thought Content: Thought content normal.        Judgment: Judgment normal.     Results for orders placed or performed in visit on 10/16/18  Thyroid Panel With TSH  Result Value Ref Range   TSH 3.380 0.450 - 4.500 uIU/mL   T4, Total 11.0 4.5 - 12.0 ug/dL   T3 Uptake Ratio 27 24 - 39 %   Free Thyroxine Index 3.0 1.2 - 4.9  Sed Rate (ESR)  Result Value Ref Range   Sed Rate 3 0 - 40 mm/hr  C-reactive protein  Result Value Ref Range   CRP 1 0 - 10 mg/L  Comprehensive metabolic panel  Result Value Ref Range   Glucose 84 65 - 99 mg/dL   BUN 11 6 - 24 mg/dL   Creatinine, Ser 0.76 0.57 - 1.00 mg/dL   GFR calc non Af Amer 86 >59 mL/min/1.73   GFR calc Af Amer 99 >59 mL/min/1.73   BUN/Creatinine Ratio 14 9 - 23   Sodium 138 134 - 144 mmol/L   Potassium 3.7 3.5 - 5.2 mmol/L   Chloride 97 96 - 106  mmol/L   CO2 26 20 - 29 mmol/L   Calcium 10.1 8.7 - 10.2 mg/dL   Total Protein 7.1 6.0 - 8.5 g/dL   Albumin 4.5 3.5 - 5.5 g/dL   Globulin, Total 2.6 1.5 - 4.5 g/dL   Albumin/Globulin Ratio 1.7 1.2 - 2.2   Bilirubin Total 0.4 0.0 -  1.2 mg/dL   Alkaline Phosphatase 88 39 - 117 IU/L   AST 14 0 - 40 IU/L   ALT 16 0 - 32 IU/L  CBC with Differential/Platelet  Result Value Ref Range   WBC 7.5 3.4 - 10.8 x10E3/uL   RBC 4.90 3.77 - 5.28 x10E6/uL   Hemoglobin 14.6 11.1 - 15.9 g/dL   Hematocrit 43.2 34.0 - 46.6 %   MCV 88 79 - 97 fL   MCH 29.8 26.6 - 33.0 pg   MCHC 33.8 31.5 - 35.7 g/dL   RDW 12.7 12.3 - 15.4 %   Platelets 334 150 - 450 x10E3/uL   Neutrophils 64 Not Estab. %   Lymphs 26 Not Estab. %   Monocytes 8 Not Estab. %   Eos 2 Not Estab. %   Basos 0 Not Estab. %   Neutrophils Absolute 4.8 1.4 - 7.0 x10E3/uL   Lymphocytes Absolute 2.0 0.7 - 3.1 x10E3/uL   Monocytes Absolute 0.6 0.1 - 0.9 x10E3/uL   EOS (ABSOLUTE) 0.1 0.0 - 0.4 x10E3/uL   Basophils Absolute 0.0 0.0 - 0.2 x10E3/uL   Immature Granulocytes 0 Not Estab. %   Immature Grans (Abs) 0.0 0.0 - 0.1 x10E3/uL      Assessment & Plan:   Problem List Items Addressed This Visit      Respiratory   Acute non-recurrent maxillary sinusitis - Primary    Acute with presentation over a week ago and ongoing symptoms.  Flu and strep negative.  Script for Augmentin sent.  Recommend use of Claritin or Zyrtec daily at home for nasal symptoms, plus continue Flonase.  Mucinex for cough as needed.  Humidifier in house plus sinus rinses.  Increase fluid intake at home.  Return for worsening or ongoing symptoms.      Relevant Medications   amoxicillin-clavulanate (AUGMENTIN) 875-125 MG tablet   Other Relevant Orders   Veritor Flu A/B Waived   Rapid Strep Screen ([facility] ONLY)     Other   Conjunctivitis    Acute, possibly related to current sinus infection.  Erythromycin gtts script sent, to apply to both eyes.  Return for  worsening or ongoing symptoms.          Follow up plan: Return in about 6 months (around 05/28/2019) for HTN.

## 2018-11-27 NOTE — Assessment & Plan Note (Signed)
Acute, possibly related to current sinus infection.  Erythromycin gtts script sent, to apply to both eyes.  Return for worsening or ongoing symptoms.

## 2018-11-27 NOTE — Patient Instructions (Signed)

## 2018-11-27 NOTE — Assessment & Plan Note (Signed)
Acute with presentation over a week ago and ongoing symptoms.  Flu and strep negative.  Script for Augmentin sent.  Recommend use of Claritin or Zyrtec daily at home for nasal symptoms, plus continue Flonase.  Mucinex for cough as needed.  Humidifier in house plus sinus rinses.  Increase fluid intake at home.  Return for worsening or ongoing symptoms.

## 2018-11-30 LAB — RAPID STREP SCREEN (MED CTR MEBANE ONLY): Strep Gp A Ag, IA W/Reflex: NEGATIVE

## 2018-11-30 LAB — VERITOR FLU A/B WAIVED
Influenza A: NEGATIVE
Influenza B: NEGATIVE

## 2018-11-30 LAB — CULTURE, GROUP A STREP: STREP A CULTURE: NEGATIVE

## 2018-12-11 ENCOUNTER — Ambulatory Visit: Payer: BLUE CROSS/BLUE SHIELD | Admitting: Nurse Practitioner

## 2018-12-11 ENCOUNTER — Other Ambulatory Visit: Payer: Self-pay

## 2018-12-11 ENCOUNTER — Encounter: Payer: Self-pay | Admitting: Nurse Practitioner

## 2018-12-11 DIAGNOSIS — J0100 Acute maxillary sinusitis, unspecified: Secondary | ICD-10-CM | POA: Diagnosis not present

## 2018-12-11 MED ORDER — PREDNISONE 10 MG PO TABS: 30.0000 mg | ORAL_TABLET | Freq: Every day | ORAL | 0 refills | Status: AC

## 2018-12-11 MED ORDER — DOXYCYCLINE HYCLATE 100 MG PO TABS: 100.0000 mg | ORAL_TABLET | Freq: Two times a day (BID) | ORAL | 0 refills | Status: AC

## 2018-12-11 NOTE — Progress Notes (Signed)
BP 111/76   Pulse 66   Temp 98.4 F (36.9 C) (Oral)   Ht 5\' 1"  (1.549 m)   Wt 177 lb (80.3 kg)   LMP  (LMP Unknown)   SpO2 97%   BMI 33.44 kg/m    Subjective:    Patient ID: Robin Soto, female    DOB: 1956/09/15, 63 y.o.   MRN: [id number]  HPI: Robin Soto is a 63 y.o. female  Chief Complaint  Patient presents with  . Follow-up  . Cough    some blood noted when blow her nose  . Sore Throat    pt states right side seems swollen  . Ear Pain    right   UPPER RESPIRATORY TRACT INFECTION Seen on 11/27/2018 for similar presentation and treated with Augmentin.  Reports ongoing symptoms with worsening. Now with discomfort to right ear and right side of throat + occasionally blood and thick yellow when blows nose. Worst symptom: cough and sore throat Fever: no Cough: no Shortness of breath: no Wheezing: no Chest pain: no Chest tightness: no Chest congestion: no Nasal congestion: yes Runny nose: yes Post nasal drip: yes Sneezing: yes Sore throat: yes Swollen glands: no Sinus pressure: yes Headache: yes Face pain: yes Toothache: yes Ear pain: yes "right Ear pressure: yes "right Eyes red/itching:no Eye drainage/crusting: no  Vomiting: no Rash: no Fatigue: yes Sick contacts: yes Strep contacts: no  Context: worse Recurrent sinusitis: no Relief with OTC cold/cough medications: no  Treatments attempted: mucinex and antibiotics   Relevant past medical, surgical, family and social history reviewed and updated as indicated. Interim medical history since our last visit reviewed. Allergies and medications reviewed and updated.  Review of Systems  Constitutional: Positive for fatigue. Negative for activity change, appetite change and fever.  HENT: Positive for congestion, postnasal drip, rhinorrhea, sinus pressure, sinus pain, sneezing and sore throat. Negative for ear discharge, ear pain, facial swelling, trouble swallowing and voice change.   Eyes: Negative  for pain and visual disturbance.  Respiratory: Negative for cough, chest tightness, shortness of breath and wheezing.   Cardiovascular: Negative for chest pain, palpitations and leg swelling.  Gastrointestinal: Negative for abdominal distention, abdominal pain, constipation, diarrhea, nausea and vomiting.  Endocrine: Negative.   Musculoskeletal: Negative for myalgias.  Neurological: Negative for dizziness, numbness and headaches.  Psychiatric/Behavioral: Negative.     Per HPI unless specifically indicated above     Objective:    BP 111/76   Pulse 66   Temp 98.4 F (36.9 C) (Oral)   Ht 5\' 1"  (1.549 m)   Wt 177 lb (80.3 kg)   LMP  (LMP Unknown)   SpO2 97%   BMI 33.44 kg/m   Wt Readings from Last 3 Encounters:  12/11/18 177 lb (80.3 kg)  11/27/18 182 lb (82.6 kg)  11/03/18 181 lb (82.1 kg)    Physical Exam Vitals signs and nursing note reviewed.  Constitutional:      General: She is awake.     Appearance: She is well-developed. She is not ill-appearing.  HENT:     Head: Normocephalic. No raccoon eyes.     Right Ear: Hearing, ear canal and external ear normal. No drainage. A middle ear effusion is present.     Left Ear: Hearing, ear canal and external ear normal. No drainage. A middle ear effusion is present.     Ears:     Comments: Small amount cerumen noted right ear, but able to visualize TM without issue.  Clear left ear.    Nose: Mucosal edema and rhinorrhea present. Rhinorrhea is purulent.     Right Sinus: Maxillary sinus tenderness present. No frontal sinus tenderness.     Left Sinus: No maxillary sinus tenderness or frontal sinus tenderness.     Mouth/Throat:     Mouth: Mucous membranes are moist.     Pharynx: Oropharynx is clear. Posterior oropharyngeal erythema (mild with cobblestoning) present. No pharyngeal swelling or oropharyngeal exudate.     Tonsils: Swelling: 0 on the right. 0 on the left.  Eyes:     General: Lids are normal.        Right eye: No  discharge.        Left eye: No discharge.     Conjunctiva/sclera: Conjunctivae normal.     Pupils: Pupils are equal, round, and reactive to light.  Neck:     Musculoskeletal: Normal range of motion and neck supple.     Thyroid: No thyromegaly.     Vascular: No carotid bruit or JVD.  Cardiovascular:     Rate and Rhythm: Normal rate and regular rhythm.     Heart sounds: Normal heart sounds. No murmur. No gallop.   Pulmonary:     Effort: Pulmonary effort is normal.     Breath sounds: Normal breath sounds.     Comments: Clear throughout. Abdominal:     General: Bowel sounds are normal.     Palpations: Abdomen is soft. There is no hepatomegaly or splenomegaly.  Musculoskeletal:     Right lower leg: No edema.     Left lower leg: No edema.  Lymphadenopathy:     Head:     Right side of head: No submental, submandibular, tonsillar or preauricular adenopathy.     Left side of head: No submental, submandibular, tonsillar or preauricular adenopathy.     Cervical: No cervical adenopathy.  Skin:    General: Skin is warm and dry.  Neurological:     Mental Status: She is alert and oriented to person, place, and time.  Psychiatric:        Attention and Perception: Attention normal.        Mood and Affect: Mood normal.        Behavior: Behavior normal. Behavior is cooperative.        Thought Content: Thought content normal.        Judgment: Judgment normal.     Results for orders placed or performed in visit on 11/27/18  Rapid Strep Screen ([facility] ONLY)  Result Value Ref Range   Strep Gp A Ag, IA W/Reflex Negative Negative  Culture, Group A Strep  Result Value Ref Range   Strep A Culture Negative   Veritor Flu A/B Waived  Result Value Ref Range   Influenza A Negative Negative   Influenza B Negative Negative      Assessment & Plan:   Problem List Items Addressed This Visit      Respiratory   Acute non-recurrent maxillary sinusitis    No improvement with Augmentin.   Script for Prednisone and Doxy sent.  Recommend taking abx with probiotic or probiotic yogurt.  Continue to use Flonase daily + daily Zyrtec or Claritin.  Mucinex as needed for cough.  Tylenol as needed for sore throat + salt water gargles.  Recommend increase rest and fluid intake.  If no improvement will consider ENT consult.  Return for worsening or continued symptoms.      Relevant Medications   doxycycline (VIBRA-TABS) 100  MG tablet   predniSONE (DELTASONE) 10 MG tablet       Follow up plan: Return if symptoms worsen or fail to improve.

## 2018-12-11 NOTE — Patient Instructions (Signed)

## 2018-12-11 NOTE — Assessment & Plan Note (Signed)
No improvement with Augmentin.  Script for Prednisone and Doxy sent.  Recommend taking abx with probiotic or probiotic yogurt.  Continue to use Flonase daily + daily Zyrtec or Claritin.  Mucinex as needed for cough.  Tylenol as needed for sore throat + salt water gargles.  Recommend increase rest and fluid intake.  If no improvement will consider ENT consult.  Return for worsening or continued symptoms.

## 2018-12-14 ENCOUNTER — Other Ambulatory Visit: Payer: Self-pay | Admitting: Unknown Physician Specialty

## 2018-12-14 NOTE — Telephone Encounter (Signed)
Requested medication (s) are due for refill today:  yes  Requested medication (s) are on the active medication list:  yes  Future visit scheduled:  yes  Last Refill: 10/16/18; #30; no refills   Requested Prescriptions  Pending Prescriptions Disp Refills   traMADol (ULTRAM) 50 MG tablet [Pharmacy Med Name: TRAMADOL HCL 50 MG TAB] 30 tablet     Sig: TAKE 1 TABLET BY MOUTH EVERY 6 HOURS AS NEEDED     Not Delegated - Analgesics:  Opioid Agonists Failed - 12/14/2018 10:57 AM      Failed - This refill cannot be delegated      Failed - Urine Drug Screen completed in last 360 days.      Passed - Valid encounter within last 6 months    Recent Outpatient Visits          3 days ago Acute non-recurrent maxillary sinusitis   [place], Henrine Screws T, NP   2 weeks ago Acute non-recurrent maxillary sinusitis   [place], Bright T, NP   1 month ago Overweight   [facility] Kathrine Haddock, NP   1 month ago Hashimoto's disease   [facility] Kathrine Haddock, NP   6 months ago Hypothyroidism, unspecified type   [facility] Trinna Post, PA-C      Future Appointments            In 5 months Cannady, Barbaraann Faster, NP [facility]

## 2018-12-14 NOTE — Telephone Encounter (Signed)
Refill request for Tramadol approved.  Chronic pain use, minimal use.

## 2019-01-02 DIAGNOSIS — M25561 Pain in right knee: Secondary | ICD-10-CM | POA: Diagnosis not present

## 2019-01-02 DIAGNOSIS — M170 Bilateral primary osteoarthritis of knee: Secondary | ICD-10-CM | POA: Diagnosis not present

## 2019-01-02 DIAGNOSIS — G8929 Other chronic pain: Secondary | ICD-10-CM | POA: Diagnosis not present

## 2019-01-02 DIAGNOSIS — M25562 Pain in left knee: Secondary | ICD-10-CM | POA: Diagnosis not present

## 2019-03-24 ENCOUNTER — Other Ambulatory Visit: Payer: Self-pay | Admitting: Unknown Physician Specialty

## 2019-03-24 ENCOUNTER — Other Ambulatory Visit: Payer: Self-pay | Admitting: Nurse Practitioner

## 2019-03-24 NOTE — Telephone Encounter (Signed)
Requested medication (s) are due for refill today - expired Rx  Requested medication (s) are on the active medication list - yes  Future visit scheduled - yes  Last refill: 09/27/17 with RF  Notes to clinic: Patient is requesting RF of non delegated Rx- sent for PCP review   Requested Prescriptions  Pending Prescriptions Disp Refills   cyclobenzaprine (FLEXERIL) 10 MG tablet [Pharmacy Med Name: CYCLOBENZAPRINE HCL 10 MG TAB] 30 tablet 3    Sig: TAKE 1 TABLET BY MOUTH AT BEDTIME     Not Delegated - Analgesics:  Muscle Relaxants Failed - 03/24/2019 12:08 PM      Failed - This refill cannot be delegated      Passed - Valid encounter within last 6 months    Recent Outpatient Visits          3 months ago Acute non-recurrent maxillary sinusitis   [place], Henrine Screws T, NP   3 months ago Acute non-recurrent maxillary sinusitis   [place], [place] T, NP   4 months ago Overweight   [place] Kathrine Haddock, NP   5 months ago Hashimoto's disease   [place] Kathrine Haddock, NP   9 months ago Hypothyroidism, unspecified type   [facility] [place], Wendee Beavers, PA-C      Future Appointments            In 2 months Cannady, Barbaraann Faster, NP [facility]            Requested Prescriptions  Pending Prescriptions Disp Refills   cyclobenzaprine (FLEXERIL) 10 MG tablet [Pharmacy Med Name: CYCLOBENZAPRINE HCL 10 MG TAB] 30 tablet 3    Sig: TAKE 1 TABLET BY MOUTH AT BEDTIME     Not Delegated - Analgesics:  Muscle Relaxants Failed - 03/24/2019 12:08 PM      Failed - This refill cannot be delegated      Passed - Valid encounter within last 6 months    Recent Outpatient Visits          3 months ago Acute non-recurrent maxillary sinusitis   [place], Henrine Screws T, NP   3 months ago Acute non-recurrent maxillary sinusitis   [place], [place] T, NP    4 months ago Overweight   [place] Kathrine Haddock, NP   5 months ago Hashimoto's disease   [place] Kathrine Haddock, NP   9 months ago Hypothyroidism, unspecified type   [facility] Trinna Post, PA-C      Future Appointments            In 2 months Cannady, Barbaraann Faster, NP [facility]

## 2019-03-24 NOTE — Telephone Encounter (Signed)
Requested medication (s) are due for refill today --yes  Requested medication (s) are on the active medication list - yes  Future visit scheduled -yes  Last refill: 12/14/18  Notes to clinic: Patient is requesting RF of non delegated Rx - sent for PCP review   Requested Prescriptions  Pending Prescriptions Disp Refills   traMADol (ULTRAM) 50 MG tablet [Pharmacy Med Name: TRAMADOL HCL 50 MG TAB] 30 tablet     Sig: TAKE 1 TABLET BY MOUTH EVERY 6 HOURS AS NEEDED     Not Delegated - Analgesics:  Opioid Agonists Failed - 03/24/2019 12:09 PM      Failed - This refill cannot be delegated      Failed - Urine Drug Screen completed in last 360 days.      Passed - Valid encounter within last 6 months    Recent Outpatient Visits          3 months ago Acute non-recurrent maxillary sinusitis   [place], [place] T, NP   3 months ago Acute non-recurrent maxillary sinusitis   [place], [place] T, NP   4 months ago Overweight   [place] Kathrine Haddock, NP   5 months ago Hashimoto's disease   [facility] Kathrine Haddock, NP   9 months ago Hypothyroidism, unspecified type   [facility] Trinna Post, PA-C      Future Appointments            In 2 months Cannady, Barbaraann Faster, NP [facility]            Requested Prescriptions  Pending Prescriptions Disp Refills   traMADol (ULTRAM) 50 MG tablet [Pharmacy Med Name: TRAMADOL HCL 50 MG TAB] 30 tablet     Sig: TAKE 1 TABLET BY MOUTH EVERY 6 HOURS AS NEEDED     Not Delegated - Analgesics:  Opioid Agonists Failed - 03/24/2019 12:09 PM      Failed - This refill cannot be delegated      Failed - Urine Drug Screen completed in last 360 days.      Passed - Valid encounter within last 6 months    Recent Outpatient Visits          3 months ago Acute non-recurrent maxillary sinusitis   [place], [place] T, NP   3  months ago Acute non-recurrent maxillary sinusitis   [place], Raymond T, NP   4 months ago Overweight   [place] Kathrine Haddock, NP   5 months ago Hashimoto's disease   [facility] Kathrine Haddock, NP   9 months ago Hypothyroidism, unspecified type   [facility] Trinna Post, PA-C      Future Appointments            In 2 months Cannady, Barbaraann Faster, NP [facility]

## 2019-04-16 ENCOUNTER — Other Ambulatory Visit: Payer: Self-pay | Admitting: Gynecologic Oncology

## 2019-04-16 DIAGNOSIS — Z1231 Encounter for screening mammogram for malignant neoplasm of breast: Secondary | ICD-10-CM

## 2019-04-24 ENCOUNTER — Other Ambulatory Visit: Payer: Self-pay

## 2019-04-24 ENCOUNTER — Ambulatory Visit
Admission: RE | Admit: 2019-04-24 | Discharge: 2019-04-24 | Disposition: A | Discharge status: Home / Self Care | Payer: 59 | Source: Ambulatory Visit | Attending: Gynecologic Oncology | Admitting: Gynecologic Oncology

## 2019-04-24 DIAGNOSIS — Z1231 Encounter for screening mammogram for malignant neoplasm of breast: Secondary | ICD-10-CM | POA: Diagnosis not present

## 2019-05-28 ENCOUNTER — Other Ambulatory Visit: Payer: Self-pay

## 2019-05-28 ENCOUNTER — Encounter: Payer: Self-pay | Admitting: Nurse Practitioner

## 2019-05-28 ENCOUNTER — Ambulatory Visit: Payer: 59 | Admitting: Nurse Practitioner

## 2019-05-28 VITALS — BP 126/83 | HR 63 | Temp 98.0°F

## 2019-05-28 DIAGNOSIS — I10 Essential (primary) hypertension: Secondary | ICD-10-CM

## 2019-05-28 DIAGNOSIS — Z13220 Encounter for screening for lipoid disorders: Secondary | ICD-10-CM

## 2019-05-28 DIAGNOSIS — E039 Hypothyroidism, unspecified: Secondary | ICD-10-CM

## 2019-05-28 DIAGNOSIS — K140 Glossitis: Secondary | ICD-10-CM | POA: Diagnosis not present

## 2019-05-28 DIAGNOSIS — L309 Dermatitis, unspecified: Secondary | ICD-10-CM

## 2019-05-28 MED ORDER — LIDOCAINE VISCOUS HCL 2 % MT SOLN: 15.0000 mL | Freq: Four times a day (QID) | OROMUCOSAL | 0 refills | Status: DC | PRN

## 2019-05-28 MED ORDER — CLOBETASOL PROPIONATE 0.05 % EX CREA: 1.0000 "application " | TOPICAL_CREAM | Freq: Two times a day (BID) | CUTANEOUS | 0 refills | Status: AC

## 2019-05-28 NOTE — Assessment & Plan Note (Signed)
Acute to right lateral aspect.  Recommend gentle mouth cleansing.  Script for viscous lidocaine to use for comfort as needed.  Return for worsening or continued issues.

## 2019-05-28 NOTE — Assessment & Plan Note (Signed)
Chronic, stable.  Continue current medication regimen.  CMP and check lipid panel today.

## 2019-05-28 NOTE — Assessment & Plan Note (Signed)
Chronic, stable.  Continue current medication regimen and adjust as needed.  Thyroid panel today.  Collaborate with endo provider.

## 2019-05-28 NOTE — Assessment & Plan Note (Signed)
To bilateral feet.  Script for Clobetasol cream.  Return for worsening or continued issues.  Recommend gentle and daily foot care.

## 2019-05-28 NOTE — Progress Notes (Signed)
BP 126/83   Pulse 63   Temp 98 F (36.7 C) (Oral)   LMP  (LMP Unknown)   SpO2 98%    Subjective:    Patient ID: Robin Soto, female    DOB: [birth year], 63 y.o.   MRN: [id number]  HPI: Robin Soto is a 63 y.o. female  Chief Complaint  Patient presents with  . Hypertension  . Hypothyroidism  . Oral Swelling    pt states she has a spot on her tongue she would like looked at on the right side    HYPERTENSION Continues on Spironolactone-HCTZ. Hypertension status: stable  Satisfied with current treatment? yes Duration of hypertension: chronic BP monitoring frequency:  not checking BP range:  BP medication side effects:  no Medication compliance: good compliance Aspirin: no Recurrent headaches: no Visual changes: no Palpitations: no Dyspnea: no Chest pain: no Lower extremity edema: baseline Dizzy/lightheaded: no   HYPOTHYROIDISM Continues on Levothyroxine 112 MCG daily. December 2019 TSH 3.380. Thyroid control status:stable Satisfied with current treatment? yes Medication side effects: no Medication compliance: good compliance Etiology of hypothyroidism: Hashimoto's Recent dose adjustment:no Fatigue: yes Cold intolerance: no Heat intolerance: no Weight gain: no Weight loss: no Constipation: no Diarrhea/loose stools: no Palpitations: no Lower extremity edema: no Anxiety/depressed mood: no   SPOT ON TONGUE: States she has had before and it went away with a mouth wash provided by dentist, to right side of her tongue.  States now her right ear is " a little pressure sore" with the spot on her tongue.  Reports are on tongue is sore when she chews food or certain foods/drinks touch it.  Denies halitosis, drainage, or URI symptoms.   DRY PATCHES FEET: Has dry patches that have been on feet for some time.  Was told by a provider they are just "old age spots".  They are more prominent when she is out in sun and tanned.  Mild pruritus at times, but not  consistent.  States they are just "annoying".  Denies pain, drainage, change in size or color.  Relevant past medical, surgical, family and social history reviewed and updated as indicated. Interim medical history since our last visit reviewed. Allergies and medications reviewed and updated.  Review of Systems  Constitutional: Positive for fatigue. Negative for activity change, appetite change, diaphoresis and fever.  HENT: Positive for ear pain and mouth sores. Negative for congestion, ear discharge, postnasal drip, rhinorrhea, sinus pressure, sinus pain, sneezing, sore throat, tinnitus and voice change.   Respiratory: Negative for cough, chest tightness, shortness of breath and wheezing.   Cardiovascular: Negative for chest pain and palpitations.  Gastrointestinal: Negative for abdominal distention, abdominal pain, constipation, diarrhea, nausea and vomiting.  Endocrine: Negative for cold intolerance and heat intolerance.  Neurological: Negative for dizziness, syncope, weakness, light-headedness, numbness and headaches.  Psychiatric/Behavioral: Negative.     Per HPI unless specifically indicated above     Objective:    BP 126/83   Pulse 63   Temp 98 F (36.7 C) (Oral)   LMP  (LMP Unknown)   SpO2 98%   Wt Readings from Last 3 Encounters:  12/11/18 177 lb (80.3 kg)  11/27/18 182 lb (82.6 kg)  11/03/18 181 lb (82.1 kg)    Physical Exam Vitals signs and nursing note reviewed.  Constitutional:      General: She is awake. She is not in acute distress.    Appearance: She is well-developed. She is obese. She is not ill-appearing.  HENT:  Head: Normocephalic.     Right Ear: Hearing, tympanic membrane, ear canal and external ear normal.     Left Ear: Hearing, tympanic membrane, ear canal and external ear normal.     Nose: Nose normal. No mucosal edema or rhinorrhea.     Mouth/Throat:     Mouth: Mucous membranes are moist.     Tongue: Lesions present.     Pharynx: Oropharynx is  clear.     Comments: Small ulceration to middle aspect of right lateral tongue, area white with mild erythema.  No other ulcerations or lesions noted. Eyes:     General: Lids are normal.        Right eye: No discharge.        Left eye: No discharge.     Conjunctiva/sclera: Conjunctivae normal.     Pupils: Pupils are equal, round, and reactive to light.  Neck:     Musculoskeletal: Normal range of motion and neck supple.     Thyroid: No thyromegaly.     Vascular: No carotid bruit.  Cardiovascular:     Rate and Rhythm: Normal rate and regular rhythm.     Pulses:          Dorsalis pedis pulses are 2+ on the right side and 2+ on the left side.       Posterior tibial pulses are 2+ on the right side and 2+ on the left side.     Heart sounds: Normal heart sounds. No murmur. No gallop.   Pulmonary:     Effort: Pulmonary effort is normal. No accessory muscle usage or respiratory distress.     Breath sounds: Normal breath sounds.  Abdominal:     General: Bowel sounds are normal.     Palpations: Abdomen is soft. There is no hepatomegaly or splenomegaly.  Musculoskeletal:     Right lower leg: 1+ Edema present.     Left lower leg: 1+ Edema present.  Feet:     Comments: Patchy areas of dry, raised white skin to dorsal aspect of bilateral feet.  No drainage or erythema.  No edema or tenderness to touch. Lymphadenopathy:     Cervical: No cervical adenopathy.  Skin:    General: Skin is warm and dry.  Neurological:     Mental Status: She is alert and oriented to person, place, and time.  Psychiatric:        Attention and Perception: Attention normal.        Mood and Affect: Mood normal.        Behavior: Behavior normal. Behavior is cooperative.        Thought Content: Thought content normal.        Judgment: Judgment normal.     Results for orders placed or performed in visit on 11/27/18  Rapid Strep Screen ([facility] ONLY)   Specimen: Nasal Swab   NASAL SWAB  Result Value Ref Range    Strep Gp A Ag, IA W/Reflex Negative Negative  Culture, Group A Strep   NASAL SWAB  Result Value Ref Range   Strep A Culture Negative   Veritor Flu A/B Waived  Result Value Ref Range   Influenza A Negative Negative   Influenza B Negative Negative      Assessment & Plan:   Problem List Items Addressed This Visit      Cardiovascular and Mediastinum   Hypertension    Chronic, stable.  Continue current medication regimen.  CMP and check lipid panel today.  Relevant Orders   Comprehensive metabolic panel     Digestive   Tongue ulcer    Acute to right lateral aspect.  Recommend gentle mouth cleansing.  Script for viscous lidocaine to use for comfort as needed.  Return for worsening or continued issues.        Endocrine   Hypothyroidism - Primary    Chronic, stable.  Continue current medication regimen and adjust as needed.  Thyroid panel today.  Collaborate with endo provider.        Relevant Orders   Thyroid Panel With TSH     Musculoskeletal and Integument   Dermatitis    To bilateral feet.  Script for Clobetasol cream.  Return for worsening or continued issues.  Recommend gentle and daily foot care.       Other Visit Diagnoses    Screening cholesterol level       Lipid panel   Relevant Orders   Lipid Panel w/o Chol/HDL Ratio       Follow up plan: Return in about 3 months (around 08/28/2019) for follow-up.

## 2019-05-28 NOTE — Patient Instructions (Signed)
Hypothyroidism  Hypothyroidism is when the thyroid gland does not make enough of certain hormones (it is underactive). The thyroid gland is a small gland located in the lower front part of the neck, just in front of the windpipe (trachea). This gland makes hormones that help control how the body uses food for energy (metabolism) as well as how the heart and brain function. These hormones also play a role in keeping your bones strong. When the thyroid is underactive, it produces too little of the hormones thyroxine (T4) and triiodothyronine (T3). What are the causes? This condition may be caused by:  Hashimoto's disease. This is a disease in which the body's disease-fighting system (immune system) attacks the thyroid gland. This is the most common cause.  Viral infections.  Pregnancy.  Certain medicines.  Birth defects.  Past radiation treatments to the head or neck for cancer.  Past treatment with radioactive iodine.  Past exposure to radiation in the environment.  Past surgical removal of part or all of the thyroid.  Problems with a gland in the center of the brain (pituitary gland).  Lack of enough iodine in the diet. What increases the risk? You are more likely to develop this condition if:  You are female.  You have a family history of thyroid conditions.  You use a medicine called lithium.  You take medicines that affect the immune system (immunosuppressants). What are the signs or symptoms? Symptoms of this condition include:  Feeling as though you have no energy (lethargy).  Not being able to tolerate cold.  Weight gain that is not explained by a change in diet or exercise habits.  Lack of appetite.  Dry skin.  Coarse hair.  Menstrual irregularity.  Slowing of thought processes.  Constipation.  Sadness or depression. How is this diagnosed? This condition may be diagnosed based on:  Your symptoms, your medical history, and a physical exam.  Blood  tests. You may also have imaging tests, such as an ultrasound or MRI. How is this treated? This condition is treated with medicine that replaces the thyroid hormones that your body does not make. After you begin treatment, it may take several weeks for symptoms to go away. Follow these instructions at home:  Take over-the-counter and prescription medicines only as told by your health care provider.  If you start taking any new medicines, tell your health care provider.  Keep all follow-up visits as told by your health care provider. This is important. ? As your condition improves, your dosage of thyroid hormone medicine may change. ? You will need to have blood tests regularly so that your health care provider can monitor your condition. Contact a health care provider if:  Your symptoms do not get better with treatment.  You are taking thyroid replacement medicine and you: ? Sweat a lot. ? Have tremors. ? Feel anxious. ? Lose weight rapidly. ? Cannot tolerate heat. ? Have emotional swings. ? Have diarrhea. ? Feel weak. Get help right away if you have:  Chest pain.  An irregular heartbeat.  A rapid heartbeat.  Difficulty breathing. Summary  Hypothyroidism is when the thyroid gland does not make enough of certain hormones (it is underactive).  When the thyroid is underactive, it produces too little of the hormones thyroxine (T4) and triiodothyronine (T3).  The most common cause is Hashimoto's disease, a disease in which the body's disease-fighting system (immune system) attacks the thyroid gland. The condition can also be caused by viral infections, medicine, pregnancy, or past   radiation treatment to the head or neck.  Symptoms may include weight gain, dry skin, constipation, feeling as though you do not have energy, and not being able to tolerate cold.  This condition is treated with medicine to replace the thyroid hormones that your body does not make. This information  is not intended to replace advice given to you by your health care provider. Make sure you discuss any questions you have with your health care provider. Document Released: 10/18/2005 Document Revised: 09/30/2017 Document Reviewed: 09/28/2017 Elsevier Patient Education  2020 [employer]  

## 2019-05-29 ENCOUNTER — Telehealth: Payer: Self-pay | Admitting: Nurse Practitioner

## 2019-05-29 LAB — LIPID PANEL W/O CHOL/HDL RATIO
Cholesterol, Total: 214 mg/dL — ABNORMAL HIGH (ref 100–199)
HDL: 48 mg/dL (ref >39)
LDL Calculated: 143 mg/dL — ABNORMAL HIGH (ref 0–99)
Triglycerides: 116 mg/dL (ref 0–149)
VLDL Cholesterol Cal: 23 mg/dL (ref 5–40)

## 2019-05-29 LAB — THYROID PANEL WITH TSH
Free Thyroxine Index: 3.2 (ref 1.2–4.9)
T3 Uptake Ratio: 28 % (ref 24–39)
T4, Total: 11.5 ug/dL (ref 4.5–12.0)
TSH: 5.65 u[IU]/mL — ABNORMAL HIGH (ref 0.450–4.500)

## 2019-05-29 LAB — COMPREHENSIVE METABOLIC PANEL
ALT: 18 IU/L (ref 0–32)
AST: 16 IU/L (ref 0–40)
Albumin/Globulin Ratio: 2.3 — ABNORMAL HIGH (ref 1.2–2.2)
Albumin: 4.6 g/dL (ref 3.8–4.9)
Alkaline Phosphatase: 76 IU/L (ref 39–117)
BUN/Creatinine Ratio: 17 (ref 12–28)
BUN: 12 mg/dL (ref 8–27)
Bilirubin Total: 0.7 mg/dL (ref 0.0–1.2)
CO2: 27 mmol/L (ref 20–29)
Calcium: 9.8 mg/dL (ref 8.7–10.3)
Chloride: 94 mmol/L — ABNORMAL LOW (ref 96–106)
Creatinine, Ser: 0.7 mg/dL (ref 0.57–1.00)
GFR calc Af Amer: 109 mL/min/{1.73_m2} (ref >59)
GFR calc non Af Amer: 94 mL/min/{1.73_m2} (ref >59)
Globulin, Total: 2 g/dL (ref 1.5–4.5)
Glucose: 87 mg/dL (ref 65–99)
Potassium: 3.5 mmol/L (ref 3.5–5.2)
Sodium: 138 mmol/L (ref 134–144)
Total Protein: 6.6 g/dL (ref 6.0–8.5)

## 2019-05-29 MED ORDER — CEPHALEXIN 250 MG PO CAPS: 250.0000 mg | ORAL_CAPSULE | Freq: Four times a day (QID) | ORAL | 0 refills | Status: AC

## 2019-05-29 NOTE — Telephone Encounter (Signed)
Copied from [place] [id number]. Topic: General - Other >> May 29, 2019  7:56 AM Keene Breath wrote: Reason for CRM: Patient called to inform the nurse or doctor that she believes she has a UTI that started this morning.  Patient just had an appt. Yesterday.  Patient would like for the doctor to call her in an antibiotic for the UTI.  Please advise and call patient back at [phone]. Patient will be there until 2:00pm. Today.

## 2019-05-29 NOTE — Telephone Encounter (Signed)
Spoke to patient via telephone and reviewed recent labs.  Will maintain current Levothyroxine dose as TSH slightly elevated, but T4 normal.  Discussed with her.  Reviewed cholesterol labs, no statin therapy at this time ASCVD 5%.  She can only take Cephalexin for UTI and not other abx.  Will send this in, but have recommended if ongoing issues she is to inform provider immediately.

## 2019-06-04 ENCOUNTER — Other Ambulatory Visit: Payer: Self-pay

## 2019-06-04 ENCOUNTER — Ambulatory Visit
Admission: RE | Admit: 2019-06-04 | Discharge: 2019-06-04 | Disposition: A | Discharge status: Home / Self Care | Payer: 59 | Source: Ambulatory Visit | Attending: Otolaryngology | Admitting: Otolaryngology

## 2019-06-04 DIAGNOSIS — E041 Nontoxic single thyroid nodule: Secondary | ICD-10-CM | POA: Diagnosis present

## 2019-07-11 ENCOUNTER — Other Ambulatory Visit: Payer: Self-pay | Admitting: Nurse Practitioner

## 2019-07-11 DIAGNOSIS — E039 Hypothyroidism, unspecified: Secondary | ICD-10-CM

## 2019-07-11 NOTE — Telephone Encounter (Signed)
Requested Prescriptions  Pending Prescriptions Disp Refills  . levothyroxine (SYNTHROID) 112 MCG tablet [Pharmacy Med Name: LEVOTHYROXINE SODIUM 112 MCG TAB] 90 tablet 0    Sig: TAKE 1 TABLET BY MOUTH ONCE DAILY FOR THYRID, ON AN EMPTY STOMACH. WAIT 30 MINUTES BEFORE TAKING OTHER MEDS.     Endocrinology:  Hypothyroid Agents Failed - 07/11/2019  4:25 PM      Failed - TSH needs to be rechecked within 3 months after an abnormal result. Refill until TSH is due.      Failed - TSH in normal range and within 360 days    TSH  Date Value Ref Range Status  05/28/2019 5.650 (H) 0.450 - 4.500 uIU/mL Final         Passed - Valid encounter within last 12 months    Recent Outpatient Visits          1 month ago Hypothyroidism, unspecified type   [place], Jolene T, NP   7 months ago Acute non-recurrent maxillary sinusitis   [place], [place] T, NP   7 months ago Acute non-recurrent maxillary sinusitis   [place], [place] T, NP   8 months ago Overweight   [facility] Kathrine Haddock, NP   8 months ago Hashimoto's disease   [facility] Kathrine Haddock, NP

## 2019-08-28 ENCOUNTER — Other Ambulatory Visit: Payer: Self-pay | Admitting: Nurse Practitioner

## 2019-08-28 MED ORDER — OMEPRAZOLE 40 MG PO CPDR: 40.0000 mg | DELAYED_RELEASE_CAPSULE | Freq: Every day | ORAL | 1 refills | Status: DC

## 2019-08-28 NOTE — Telephone Encounter (Signed)
Medication Refill - Medication: omeprazole (PRILOSEC) 40 MG capsule    Has the patient contacted their pharmacy? Yes.   (Agent: If no, request that the patient contact the pharmacy for the refill.) (Agent: If yes, when and what did the pharmacy advise?)  Preferred Pharmacy (with phone number or street name):  [facility] - [place], [place] [address]  Phone: [phone] Fax: [phone]    Agent: Please be advised that RX refills may take up to 3 business days. We ask that you follow-up with your pharmacy.

## 2020-01-07 ENCOUNTER — Other Ambulatory Visit: Payer: Self-pay | Admitting: Nurse Practitioner

## 2020-01-07 NOTE — Telephone Encounter (Signed)
Refill request for TRAMADOL HCL 50 MG TAB.

## 2020-01-08 NOTE — Telephone Encounter (Signed)
Pt. Has apt for 02/01/2020

## 2020-01-08 NOTE — Telephone Encounter (Signed)
PMP reviewed, no recent controlled substance scripts noted.

## 2020-01-08 NOTE — Telephone Encounter (Signed)
Routing to provider  

## 2020-02-01 ENCOUNTER — Ambulatory Visit: Payer: 59 | Admitting: Nurse Practitioner

## 2020-02-04 ENCOUNTER — Other Ambulatory Visit: Payer: Self-pay | Admitting: Nurse Practitioner

## 2020-02-04 ENCOUNTER — Encounter: Payer: Self-pay | Admitting: Unknown Physician Specialty

## 2020-02-04 ENCOUNTER — Ambulatory Visit: Payer: 59 | Admitting: Unknown Physician Specialty

## 2020-02-04 ENCOUNTER — Other Ambulatory Visit: Payer: Self-pay

## 2020-02-04 DIAGNOSIS — J301 Allergic rhinitis due to pollen: Secondary | ICD-10-CM

## 2020-02-04 DIAGNOSIS — I10 Essential (primary) hypertension: Secondary | ICD-10-CM | POA: Diagnosis not present

## 2020-02-04 DIAGNOSIS — E785 Hyperlipidemia, unspecified: Secondary | ICD-10-CM | POA: Diagnosis not present

## 2020-02-04 DIAGNOSIS — E663 Overweight: Secondary | ICD-10-CM | POA: Diagnosis not present

## 2020-02-04 DIAGNOSIS — E039 Hypothyroidism, unspecified: Secondary | ICD-10-CM

## 2020-02-04 MED ORDER — OMEPRAZOLE 40 MG PO CPDR: 40.0000 mg | DELAYED_RELEASE_CAPSULE | Freq: Every day | ORAL | 1 refills | Status: AC

## 2020-02-04 MED ORDER — FLUTICASONE PROPIONATE 50 MCG/ACT NA SUSP: 2.0000 | Freq: Every day | NASAL | 6 refills | Status: AC

## 2020-02-04 NOTE — Assessment & Plan Note (Signed)
Followed by ENT 

## 2020-02-04 NOTE — Assessment & Plan Note (Signed)
Stable, continue present medications.   

## 2020-02-04 NOTE — Progress Notes (Signed)
BP 121/76   Pulse 69   Temp 98.3 F (36.8 C) (Oral)   Ht 5' 1.5" (1.562 m)   Wt 185 lb 3.2 oz (84 kg)   LMP  (LMP Unknown)   SpO2 98%   BMI 34.43 kg/m    Subjective:    Patient ID: Robin Soto, female    DOB: 1956/01/10, 63 y.o.   MRN: [id number]  HPI: Robin Soto is a 64 y.o. female  Chief Complaint  Patient presents with  . Gastroesophageal Reflux  . Hypertension   Hypertension Using medications without difficulty Average home BPs   No problems or lightheadedness No chest pain with exertion or shortness of breath No Edema  Hypothyroid Followed by ENT  GERD Omeprazole working well.  Needs a refill  Rhinnitis Wants refill of Flonase   The 10-year ASCVD risk score Mikey Bussing DC Jr., et al., 2013) is: 5%   Values used to calculate the score:     Age: 89 years     Sex: Female     Is Non-Hispanic African American: No     Diabetic: No     Tobacco smoker: No     Systolic Blood Pressure: 123XX123 mmHg     Is BP treated: Yes     HDL Cholesterol: 48 mg/dL     Total Cholesterol: 214 mg/dL   Relevant past medical, surgical, family and social history reviewed and updated as indicated. Interim medical history since our last visit reviewed. Allergies and medications reviewed and updated.  Review of Systems  Per HPI unless specifically indicated above     Objective:    BP 121/76   Pulse 69   Temp 98.3 F (36.8 C) (Oral)   Ht 5' 1.5" (1.562 m)   Wt 185 lb 3.2 oz (84 kg)   LMP  (LMP Unknown)   SpO2 98%   BMI 34.43 kg/m   Wt Readings from Last 3 Encounters:  02/04/20 185 lb 3.2 oz (84 kg)  12/11/18 177 lb (80.3 kg)  11/27/18 182 lb (82.6 kg)    Physical Exam Constitutional:      General: She is not in acute distress.    Appearance: Normal appearance. She is well-developed.  HENT:     Head: Normocephalic and atraumatic.  Eyes:     General: Lids are normal. No scleral icterus.       Right eye: No discharge.        Left eye: No discharge.   Conjunctiva/sclera: Conjunctivae normal.  Neck:     Vascular: No carotid bruit or JVD.  Cardiovascular:     Rate and Rhythm: Normal rate and regular rhythm.     Heart sounds: Normal heart sounds.  Pulmonary:     Effort: Pulmonary effort is normal.     Breath sounds: Normal breath sounds.  Abdominal:     Palpations: There is no hepatomegaly or splenomegaly.  Musculoskeletal:        General: Normal range of motion.     Cervical back: Normal range of motion and neck supple.  Skin:    General: Skin is warm and dry.     Coloration: Skin is not pale.     Findings: No rash.  Neurological:     Mental Status: She is alert and oriented to person, place, and time.  Psychiatric:        Behavior: Behavior normal.        Thought Content: Thought content normal.  Judgment: Judgment normal.     Results for orders placed or performed in visit on 05/28/19  Thyroid Panel With TSH  Result Value Ref Range   TSH 5.650 (H) 0.450 - 4.500 uIU/mL   T4, Total 11.5 4.5 - 12.0 ug/dL   T3 Uptake Ratio 28 24 - 39 %   Free Thyroxine Index 3.2 1.2 - 4.9  Comprehensive metabolic panel  Result Value Ref Range   Glucose 87 65 - 99 mg/dL   BUN 12 8 - 27 mg/dL   Creatinine, Ser 0.70 0.57 - 1.00 mg/dL   GFR calc non Af Amer 94 >59 mL/min/1.73   GFR calc Af Amer 109 >59 mL/min/1.73   BUN/Creatinine Ratio 17 12 - 28   Sodium 138 134 - 144 mmol/L   Potassium 3.5 3.5 - 5.2 mmol/L   Chloride 94 (L) 96 - 106 mmol/L   CO2 27 20 - 29 mmol/L   Calcium 9.8 8.7 - 10.3 mg/dL   Total Protein 6.6 6.0 - 8.5 g/dL   Albumin 4.6 3.8 - 4.9 g/dL   Globulin, Total 2.0 1.5 - 4.5 g/dL   Albumin/Globulin Ratio 2.3 (H) 1.2 - 2.2   Bilirubin Total 0.7 0.0 - 1.2 mg/dL   Alkaline Phosphatase 76 39 - 117 IU/L   AST 16 0 - 40 IU/L   ALT 18 0 - 32 IU/L  Lipid Panel w/o Chol/HDL Ratio  Result Value Ref Range   Cholesterol, Total 214 (H) 100 - 199 mg/dL   Triglycerides 116 0 - 149 mg/dL   HDL 48 >39 mg/dL   VLDL  Cholesterol Cal 23 5 - 40 mg/dL   LDL Calculated 143 (H) 0 - 99 mg/dL      Assessment & Plan:   Problem List Items Addressed This Visit      Unprioritized   Allergic rhinitis    Stable, continue present medications.        Hyperlipidemia    ASCVD risk score 5%.  Pt would like to have thyroid adjusted and work on lifestyle and recheck at physical      Hypertension    Stable, continue present medications.        Hypothyroidism    Followed by ENT      Relevant Medications   levothyroxine (SYNTHROID) 137 MCG tablet   Overweight    Has gained weight.  Thyroid is "messed up" and being followed and adjusted by ENT          Follow up plan: 3 months for physical

## 2020-02-04 NOTE — Telephone Encounter (Signed)
Routing to provider  

## 2020-02-04 NOTE — Assessment & Plan Note (Signed)
ASCVD risk score 5%.  Pt would like to have thyroid adjusted and work on lifestyle and recheck at physical

## 2020-02-04 NOTE — Assessment & Plan Note (Signed)
Has gained weight.  Thyroid is "messed up" and being followed and adjusted by ENT

## 2020-02-05 NOTE — Telephone Encounter (Signed)
Called patient to Jolene's response, no answer, left a voicemail for patient to return my call.

## 2020-02-06 NOTE — Telephone Encounter (Signed)
Called patient to give her Jolene's response, no answer, left a voicemail for patient to return my call.

## 2021-07-15 ENCOUNTER — Ambulatory Visit: Payer: 59 | Admitting: Dermatology

## 2021-11-19 DIAGNOSIS — E538 Deficiency of other specified B group vitamins: Secondary | ICD-10-CM | POA: Diagnosis not present

## 2021-11-23 DIAGNOSIS — M5136 Other intervertebral disc degeneration, lumbar region: Secondary | ICD-10-CM | POA: Diagnosis not present

## 2021-11-23 DIAGNOSIS — M5416 Radiculopathy, lumbar region: Secondary | ICD-10-CM | POA: Diagnosis not present

## 2021-12-08 DIAGNOSIS — E039 Hypothyroidism, unspecified: Secondary | ICD-10-CM | POA: Diagnosis not present

## 2021-12-08 DIAGNOSIS — R635 Abnormal weight gain: Secondary | ICD-10-CM | POA: Diagnosis not present

## 2021-12-08 DIAGNOSIS — E042 Nontoxic multinodular goiter: Secondary | ICD-10-CM | POA: Diagnosis not present

## 2021-12-10 DIAGNOSIS — R238 Other skin changes: Secondary | ICD-10-CM | POA: Diagnosis not present

## 2021-12-22 DIAGNOSIS — M25551 Pain in right hip: Secondary | ICD-10-CM | POA: Diagnosis not present

## 2021-12-22 DIAGNOSIS — R262 Difficulty in walking, not elsewhere classified: Secondary | ICD-10-CM | POA: Diagnosis not present

## 2021-12-22 DIAGNOSIS — M545 Low back pain, unspecified: Secondary | ICD-10-CM | POA: Diagnosis not present

## 2022-01-05 DIAGNOSIS — L570 Actinic keratosis: Secondary | ICD-10-CM | POA: Diagnosis not present

## 2022-01-05 DIAGNOSIS — Z7189 Other specified counseling: Secondary | ICD-10-CM | POA: Diagnosis not present

## 2022-01-05 DIAGNOSIS — Z85828 Personal history of other malignant neoplasm of skin: Secondary | ICD-10-CM | POA: Diagnosis not present

## 2022-01-05 DIAGNOSIS — D225 Melanocytic nevi of trunk: Secondary | ICD-10-CM | POA: Diagnosis not present

## 2022-01-05 DIAGNOSIS — X32XXXA Exposure to sunlight, initial encounter: Secondary | ICD-10-CM | POA: Diagnosis not present

## 2022-01-05 DIAGNOSIS — L568 Other specified acute skin changes due to ultraviolet radiation: Secondary | ICD-10-CM | POA: Diagnosis not present

## 2022-01-05 DIAGNOSIS — Z08 Encounter for follow-up examination after completed treatment for malignant neoplasm: Secondary | ICD-10-CM | POA: Diagnosis not present

## 2022-01-05 DIAGNOSIS — D485 Neoplasm of uncertain behavior of skin: Secondary | ICD-10-CM | POA: Diagnosis not present

## 2022-01-05 DIAGNOSIS — L821 Other seborrheic keratosis: Secondary | ICD-10-CM | POA: Diagnosis not present

## 2022-01-18 DIAGNOSIS — M50323 Other cervical disc degeneration at C6-C7 level: Secondary | ICD-10-CM | POA: Diagnosis not present

## 2022-01-18 DIAGNOSIS — M5136 Other intervertebral disc degeneration, lumbar region: Secondary | ICD-10-CM | POA: Diagnosis not present

## 2022-01-18 DIAGNOSIS — M4312 Spondylolisthesis, cervical region: Secondary | ICD-10-CM | POA: Diagnosis not present

## 2022-01-18 DIAGNOSIS — M5416 Radiculopathy, lumbar region: Secondary | ICD-10-CM | POA: Diagnosis not present

## 2022-01-18 DIAGNOSIS — M542 Cervicalgia: Secondary | ICD-10-CM | POA: Diagnosis not present

## 2022-01-21 DIAGNOSIS — E538 Deficiency of other specified B group vitamins: Secondary | ICD-10-CM | POA: Diagnosis not present

## 2022-02-03 DIAGNOSIS — K219 Gastro-esophageal reflux disease without esophagitis: Secondary | ICD-10-CM | POA: Diagnosis not present

## 2022-02-03 DIAGNOSIS — E538 Deficiency of other specified B group vitamins: Secondary | ICD-10-CM | POA: Diagnosis not present

## 2022-02-03 DIAGNOSIS — R7309 Other abnormal glucose: Secondary | ICD-10-CM | POA: Diagnosis not present

## 2022-02-03 DIAGNOSIS — M797 Fibromyalgia: Secondary | ICD-10-CM | POA: Diagnosis not present

## 2022-02-03 DIAGNOSIS — E038 Other specified hypothyroidism: Secondary | ICD-10-CM | POA: Diagnosis not present

## 2022-02-03 DIAGNOSIS — E7800 Pure hypercholesterolemia, unspecified: Secondary | ICD-10-CM | POA: Diagnosis not present

## 2022-02-23 DIAGNOSIS — E538 Deficiency of other specified B group vitamins: Secondary | ICD-10-CM | POA: Diagnosis not present

## 2022-03-18 DIAGNOSIS — G608 Other hereditary and idiopathic neuropathies: Secondary | ICD-10-CM | POA: Diagnosis not present

## 2022-03-18 DIAGNOSIS — M797 Fibromyalgia: Secondary | ICD-10-CM | POA: Diagnosis not present

## 2022-03-18 DIAGNOSIS — I890 Lymphedema, not elsewhere classified: Secondary | ICD-10-CM | POA: Diagnosis not present

## 2022-03-18 DIAGNOSIS — E538 Deficiency of other specified B group vitamins: Secondary | ICD-10-CM | POA: Diagnosis not present

## 2022-03-18 DIAGNOSIS — G250 Essential tremor: Secondary | ICD-10-CM | POA: Diagnosis not present

## 2022-03-30 DIAGNOSIS — E538 Deficiency of other specified B group vitamins: Secondary | ICD-10-CM | POA: Diagnosis not present

## 2022-04-30 DIAGNOSIS — E538 Deficiency of other specified B group vitamins: Secondary | ICD-10-CM | POA: Diagnosis not present

## 2022-05-05 DIAGNOSIS — M170 Bilateral primary osteoarthritis of knee: Secondary | ICD-10-CM | POA: Diagnosis not present

## 2022-05-05 DIAGNOSIS — M25461 Effusion, right knee: Secondary | ICD-10-CM | POA: Diagnosis not present

## 2022-05-05 DIAGNOSIS — M25462 Effusion, left knee: Secondary | ICD-10-CM | POA: Diagnosis not present

## 2022-05-31 DIAGNOSIS — E538 Deficiency of other specified B group vitamins: Secondary | ICD-10-CM | POA: Diagnosis not present

## 2022-06-07 ENCOUNTER — Other Ambulatory Visit: Payer: Self-pay | Admitting: Internal Medicine

## 2022-06-07 DIAGNOSIS — Z1231 Encounter for screening mammogram for malignant neoplasm of breast: Secondary | ICD-10-CM

## 2022-06-09 DIAGNOSIS — E042 Nontoxic multinodular goiter: Secondary | ICD-10-CM | POA: Diagnosis not present

## 2022-06-09 DIAGNOSIS — E039 Hypothyroidism, unspecified: Secondary | ICD-10-CM | POA: Diagnosis not present

## 2022-06-29 DIAGNOSIS — Z1331 Encounter for screening for depression: Secondary | ICD-10-CM | POA: Diagnosis not present

## 2022-06-29 DIAGNOSIS — C539 Malignant neoplasm of cervix uteri, unspecified: Secondary | ICD-10-CM | POA: Diagnosis not present

## 2022-06-29 DIAGNOSIS — E063 Autoimmune thyroiditis: Secondary | ICD-10-CM | POA: Diagnosis not present

## 2022-06-29 DIAGNOSIS — I890 Lymphedema, not elsewhere classified: Secondary | ICD-10-CM | POA: Diagnosis not present

## 2022-06-29 DIAGNOSIS — M797 Fibromyalgia: Secondary | ICD-10-CM | POA: Diagnosis not present

## 2022-06-29 DIAGNOSIS — I10 Essential (primary) hypertension: Secondary | ICD-10-CM | POA: Diagnosis not present

## 2022-06-29 DIAGNOSIS — K219 Gastro-esophageal reflux disease without esophagitis: Secondary | ICD-10-CM | POA: Diagnosis not present

## 2022-06-29 DIAGNOSIS — K589 Irritable bowel syndrome without diarrhea: Secondary | ICD-10-CM | POA: Diagnosis not present

## 2022-06-29 DIAGNOSIS — Z0000 Encounter for general adult medical examination without abnormal findings: Secondary | ICD-10-CM | POA: Diagnosis not present

## 2022-06-29 DIAGNOSIS — Z6835 Body mass index (BMI) 35.0-35.9, adult: Secondary | ICD-10-CM | POA: Diagnosis not present

## 2022-06-29 DIAGNOSIS — R7309 Other abnormal glucose: Secondary | ICD-10-CM | POA: Diagnosis not present

## 2022-06-29 DIAGNOSIS — M170 Bilateral primary osteoarthritis of knee: Secondary | ICD-10-CM | POA: Diagnosis not present

## 2022-07-01 ENCOUNTER — Inpatient Hospital Stay
Admission: RE | Admit: 2022-07-01 | Discharge: 2022-07-01 | Disposition: A | Discharge status: Home / Self Care | Payer: Self-pay | Source: Ambulatory Visit | Attending: *Deleted | Admitting: *Deleted

## 2022-07-01 ENCOUNTER — Other Ambulatory Visit: Payer: Self-pay | Admitting: *Deleted

## 2022-07-01 ENCOUNTER — Ambulatory Visit
Admission: RE | Admit: 2022-07-01 | Discharge: 2022-07-01 | Disposition: A | Discharge status: Home / Self Care | Payer: 59 | Source: Ambulatory Visit | Attending: Internal Medicine | Admitting: Internal Medicine

## 2022-07-01 DIAGNOSIS — Z1231 Encounter for screening mammogram for malignant neoplasm of breast: Secondary | ICD-10-CM | POA: Diagnosis not present

## 2022-07-01 DIAGNOSIS — E538 Deficiency of other specified B group vitamins: Secondary | ICD-10-CM | POA: Diagnosis not present

## 2022-07-13 DIAGNOSIS — G608 Other hereditary and idiopathic neuropathies: Secondary | ICD-10-CM | POA: Diagnosis not present

## 2022-07-13 DIAGNOSIS — M797 Fibromyalgia: Secondary | ICD-10-CM | POA: Diagnosis not present

## 2022-07-13 DIAGNOSIS — I890 Lymphedema, not elsewhere classified: Secondary | ICD-10-CM | POA: Diagnosis not present

## 2022-07-13 DIAGNOSIS — G8929 Other chronic pain: Secondary | ICD-10-CM | POA: Diagnosis not present

## 2022-07-13 DIAGNOSIS — E538 Deficiency of other specified B group vitamins: Secondary | ICD-10-CM | POA: Diagnosis not present

## 2022-07-13 DIAGNOSIS — M25561 Pain in right knee: Secondary | ICD-10-CM | POA: Diagnosis not present

## 2022-07-13 DIAGNOSIS — M25562 Pain in left knee: Secondary | ICD-10-CM | POA: Diagnosis not present

## 2022-07-29 DIAGNOSIS — M25561 Pain in right knee: Secondary | ICD-10-CM | POA: Diagnosis not present

## 2022-07-29 DIAGNOSIS — M25461 Effusion, right knee: Secondary | ICD-10-CM | POA: Diagnosis not present

## 2022-07-29 DIAGNOSIS — M25462 Effusion, left knee: Secondary | ICD-10-CM | POA: Diagnosis not present

## 2022-07-29 DIAGNOSIS — M25562 Pain in left knee: Secondary | ICD-10-CM | POA: Diagnosis not present

## 2022-07-29 DIAGNOSIS — M170 Bilateral primary osteoarthritis of knee: Secondary | ICD-10-CM | POA: Diagnosis not present

## 2022-07-29 DIAGNOSIS — G8929 Other chronic pain: Secondary | ICD-10-CM | POA: Diagnosis not present

## 2022-09-03 DIAGNOSIS — E538 Deficiency of other specified B group vitamins: Secondary | ICD-10-CM | POA: Diagnosis not present

## 2022-10-15 DIAGNOSIS — Z7189 Other specified counseling: Secondary | ICD-10-CM | POA: Diagnosis not present

## 2022-10-15 DIAGNOSIS — Z872 Personal history of diseases of the skin and subcutaneous tissue: Secondary | ICD-10-CM | POA: Diagnosis not present

## 2022-10-15 DIAGNOSIS — L720 Epidermal cyst: Secondary | ICD-10-CM | POA: Diagnosis not present

## 2022-10-15 DIAGNOSIS — L821 Other seborrheic keratosis: Secondary | ICD-10-CM | POA: Diagnosis not present

## 2022-10-15 DIAGNOSIS — L853 Xerosis cutis: Secondary | ICD-10-CM | POA: Diagnosis not present

## 2022-10-15 DIAGNOSIS — X32XXXA Exposure to sunlight, initial encounter: Secondary | ICD-10-CM | POA: Diagnosis not present

## 2022-10-15 DIAGNOSIS — L570 Actinic keratosis: Secondary | ICD-10-CM | POA: Diagnosis not present

## 2022-10-15 DIAGNOSIS — Z08 Encounter for follow-up examination after completed treatment for malignant neoplasm: Secondary | ICD-10-CM | POA: Diagnosis not present

## 2022-10-15 DIAGNOSIS — D225 Melanocytic nevi of trunk: Secondary | ICD-10-CM | POA: Diagnosis not present

## 2022-10-15 DIAGNOSIS — D485 Neoplasm of uncertain behavior of skin: Secondary | ICD-10-CM | POA: Diagnosis not present

## 2022-10-15 DIAGNOSIS — Z85828 Personal history of other malignant neoplasm of skin: Secondary | ICD-10-CM | POA: Diagnosis not present

## 2022-11-05 DIAGNOSIS — E538 Deficiency of other specified B group vitamins: Secondary | ICD-10-CM | POA: Diagnosis not present

## 2022-11-05 DIAGNOSIS — M1712 Unilateral primary osteoarthritis, left knee: Secondary | ICD-10-CM | POA: Diagnosis not present

## 2022-11-05 DIAGNOSIS — M25562 Pain in left knee: Secondary | ICD-10-CM | POA: Diagnosis not present

## 2022-11-05 DIAGNOSIS — M25561 Pain in right knee: Secondary | ICD-10-CM | POA: Diagnosis not present

## 2022-11-05 DIAGNOSIS — M25462 Effusion, left knee: Secondary | ICD-10-CM | POA: Diagnosis not present

## 2022-11-05 DIAGNOSIS — G8929 Other chronic pain: Secondary | ICD-10-CM | POA: Diagnosis not present

## 2022-11-05 DIAGNOSIS — M25461 Effusion, right knee: Secondary | ICD-10-CM | POA: Diagnosis not present

## 2022-11-05 DIAGNOSIS — M1711 Unilateral primary osteoarthritis, right knee: Secondary | ICD-10-CM | POA: Diagnosis not present

## 2022-12-06 DIAGNOSIS — E538 Deficiency of other specified B group vitamins: Secondary | ICD-10-CM | POA: Diagnosis not present

## 2022-12-16 DIAGNOSIS — E042 Nontoxic multinodular goiter: Secondary | ICD-10-CM | POA: Diagnosis not present

## 2022-12-16 DIAGNOSIS — E039 Hypothyroidism, unspecified: Secondary | ICD-10-CM | POA: Diagnosis not present

## 2022-12-24 DIAGNOSIS — M170 Bilateral primary osteoarthritis of knee: Secondary | ICD-10-CM | POA: Diagnosis not present

## 2022-12-24 DIAGNOSIS — K219 Gastro-esophageal reflux disease without esophagitis: Secondary | ICD-10-CM | POA: Diagnosis not present

## 2022-12-24 DIAGNOSIS — Z0000 Encounter for general adult medical examination without abnormal findings: Secondary | ICD-10-CM | POA: Diagnosis not present

## 2022-12-24 DIAGNOSIS — I890 Lymphedema, not elsewhere classified: Secondary | ICD-10-CM | POA: Diagnosis not present

## 2022-12-24 DIAGNOSIS — Z6835 Body mass index (BMI) 35.0-35.9, adult: Secondary | ICD-10-CM | POA: Diagnosis not present

## 2022-12-24 DIAGNOSIS — I10 Essential (primary) hypertension: Secondary | ICD-10-CM | POA: Diagnosis not present

## 2022-12-24 DIAGNOSIS — K589 Irritable bowel syndrome without diarrhea: Secondary | ICD-10-CM | POA: Diagnosis not present

## 2022-12-24 DIAGNOSIS — R7309 Other abnormal glucose: Secondary | ICD-10-CM | POA: Diagnosis not present

## 2022-12-24 DIAGNOSIS — Z79899 Other long term (current) drug therapy: Secondary | ICD-10-CM | POA: Diagnosis not present

## 2022-12-24 DIAGNOSIS — E063 Autoimmune thyroiditis: Secondary | ICD-10-CM | POA: Diagnosis not present

## 2022-12-24 DIAGNOSIS — C539 Malignant neoplasm of cervix uteri, unspecified: Secondary | ICD-10-CM | POA: Diagnosis not present

## 2022-12-24 DIAGNOSIS — M797 Fibromyalgia: Secondary | ICD-10-CM | POA: Diagnosis not present

## 2022-12-31 DIAGNOSIS — Z6836 Body mass index (BMI) 36.0-36.9, adult: Secondary | ICD-10-CM | POA: Diagnosis not present

## 2022-12-31 DIAGNOSIS — R5381 Other malaise: Secondary | ICD-10-CM | POA: Diagnosis not present

## 2022-12-31 DIAGNOSIS — E876 Hypokalemia: Secondary | ICD-10-CM | POA: Diagnosis not present

## 2022-12-31 DIAGNOSIS — R7309 Other abnormal glucose: Secondary | ICD-10-CM | POA: Diagnosis not present

## 2022-12-31 DIAGNOSIS — I890 Lymphedema, not elsewhere classified: Secondary | ICD-10-CM | POA: Diagnosis not present

## 2022-12-31 DIAGNOSIS — E039 Hypothyroidism, unspecified: Secondary | ICD-10-CM | POA: Diagnosis not present

## 2022-12-31 DIAGNOSIS — R5382 Chronic fatigue, unspecified: Secondary | ICD-10-CM | POA: Diagnosis not present

## 2022-12-31 DIAGNOSIS — Z8541 Personal history of malignant neoplasm of cervix uteri: Secondary | ICD-10-CM | POA: Diagnosis not present

## 2022-12-31 DIAGNOSIS — M797 Fibromyalgia: Secondary | ICD-10-CM | POA: Diagnosis not present

## 2022-12-31 DIAGNOSIS — K219 Gastro-esophageal reflux disease without esophagitis: Secondary | ICD-10-CM | POA: Diagnosis not present

## 2022-12-31 DIAGNOSIS — I10 Essential (primary) hypertension: Secondary | ICD-10-CM | POA: Diagnosis not present

## 2023-01-04 DIAGNOSIS — R899 Unspecified abnormal finding in specimens from other organs, systems and tissues: Secondary | ICD-10-CM | POA: Diagnosis not present

## 2023-01-06 DIAGNOSIS — E538 Deficiency of other specified B group vitamins: Secondary | ICD-10-CM | POA: Diagnosis not present

## 2023-01-10 DIAGNOSIS — M797 Fibromyalgia: Secondary | ICD-10-CM | POA: Diagnosis not present

## 2023-01-10 DIAGNOSIS — G250 Essential tremor: Secondary | ICD-10-CM | POA: Diagnosis not present

## 2023-01-10 DIAGNOSIS — I890 Lymphedema, not elsewhere classified: Secondary | ICD-10-CM | POA: Diagnosis not present

## 2023-01-10 DIAGNOSIS — G608 Other hereditary and idiopathic neuropathies: Secondary | ICD-10-CM | POA: Diagnosis not present

## 2023-06-23 ENCOUNTER — Other Ambulatory Visit: Payer: Self-pay | Admitting: Internal Medicine

## 2023-06-23 DIAGNOSIS — Z1231 Encounter for screening mammogram for malignant neoplasm of breast: Secondary | ICD-10-CM

## 2023-07-07 ENCOUNTER — Ambulatory Visit
Admission: RE | Admit: 2023-07-07 | Discharge: 2023-07-07 | Disposition: A | Discharge status: Home / Self Care | Payer: 59 | Source: Ambulatory Visit | Attending: Internal Medicine | Admitting: Internal Medicine

## 2023-07-07 DIAGNOSIS — Z1231 Encounter for screening mammogram for malignant neoplasm of breast: Secondary | ICD-10-CM | POA: Diagnosis present

## 2024-01-23 LAB — COLOGUARD: COLOGUARD: NEGATIVE

## 2024-06-13 ENCOUNTER — Other Ambulatory Visit: Payer: Self-pay | Admitting: Internal Medicine

## 2024-06-13 DIAGNOSIS — Z1231 Encounter for screening mammogram for malignant neoplasm of breast: Secondary | ICD-10-CM

## 2024-07-09 ENCOUNTER — Ambulatory Visit
Admission: RE | Admit: 2024-07-09 | Discharge: 2024-07-09 | Disposition: A | Discharge status: Home / Self Care | Payer: Self-pay | Source: Ambulatory Visit | Attending: Internal Medicine | Admitting: Internal Medicine

## 2024-07-09 DIAGNOSIS — Z1231 Encounter for screening mammogram for malignant neoplasm of breast: Secondary | ICD-10-CM | POA: Insufficient documentation
